# Patient Record
Sex: Female | Born: 1945 | Race: White | Hispanic: No | Smoking: Never smoker
Health system: Southern US, Community
[De-identification: ages and names within clinical notes are randomized; demographics above are authoritative.]

## PROBLEM LIST (undated history)

## (undated) DIAGNOSIS — A159 Respiratory tuberculosis unspecified: Secondary | ICD-10-CM

## (undated) DIAGNOSIS — R6889 Other general symptoms and signs: Secondary | ICD-10-CM

## (undated) DIAGNOSIS — M1990 Unspecified osteoarthritis, unspecified site: Secondary | ICD-10-CM

## (undated) DIAGNOSIS — H269 Unspecified cataract: Secondary | ICD-10-CM

## (undated) DIAGNOSIS — F419 Anxiety disorder, unspecified: Secondary | ICD-10-CM

## (undated) DIAGNOSIS — E785 Hyperlipidemia, unspecified: Secondary | ICD-10-CM

## (undated) DIAGNOSIS — K5792 Diverticulitis of intestine, part unspecified, without perforation or abscess without bleeding: Secondary | ICD-10-CM

## (undated) DIAGNOSIS — I10 Essential (primary) hypertension: Secondary | ICD-10-CM

## (undated) HISTORY — DX: Unspecified cataract: H26.9

## (undated) HISTORY — PX: OTHER SURGICAL HISTORY: SHX169

## (undated) HISTORY — PX: NEPHRECTOMY: SHX65

## (undated) HISTORY — PX: ABDOMINAL HYSTERECTOMY: SHX81

## (undated) HISTORY — DX: Diverticulitis of intestine, part unspecified, without perforation or abscess without bleeding: K57.92

## (undated) HISTORY — DX: Anxiety disorder, unspecified: F41.9

## (undated) HISTORY — DX: Respiratory tuberculosis unspecified: A15.9

## (undated) HISTORY — DX: Essential (primary) hypertension: I10

## (undated) HISTORY — PX: BREAST BIOPSY: SHX20

## (undated) HISTORY — DX: Unspecified osteoarthritis, unspecified site: M19.90

## (undated) HISTORY — DX: Other general symptoms and signs: R68.89

## (undated) HISTORY — DX: Hyperlipidemia, unspecified: E78.5

---

## 1991-11-06 HISTORY — PX: NEPHRECTOMY: SHX65

## 1995-11-06 HISTORY — PX: ABDOMINAL HYSTERECTOMY: SHX81

## 1998-03-17 ENCOUNTER — Ambulatory Visit (HOSPITAL_COMMUNITY): Admission: RE | Admit: 1998-03-17 | Discharge: 1998-03-17 | Payer: Self-pay | Admitting: Obstetrics and Gynecology

## 1999-02-16 ENCOUNTER — Other Ambulatory Visit: Admission: RE | Admit: 1999-02-16 | Discharge: 1999-02-16 | Payer: Self-pay | Admitting: Obstetrics and Gynecology

## 1999-03-16 ENCOUNTER — Ambulatory Visit (HOSPITAL_COMMUNITY): Admission: RE | Admit: 1999-03-16 | Discharge: 1999-03-16 | Payer: Self-pay | Admitting: Obstetrics and Gynecology

## 1999-03-16 ENCOUNTER — Encounter: Payer: Self-pay | Admitting: Obstetrics and Gynecology

## 1999-03-23 ENCOUNTER — Encounter: Payer: Self-pay | Admitting: Obstetrics and Gynecology

## 1999-03-23 ENCOUNTER — Ambulatory Visit (HOSPITAL_COMMUNITY): Admission: RE | Admit: 1999-03-23 | Discharge: 1999-03-23 | Payer: Self-pay | Admitting: Obstetrics and Gynecology

## 1999-09-21 ENCOUNTER — Encounter: Payer: Self-pay | Admitting: Obstetrics and Gynecology

## 1999-09-21 ENCOUNTER — Ambulatory Visit (HOSPITAL_COMMUNITY): Admission: RE | Admit: 1999-09-21 | Discharge: 1999-09-21 | Payer: Self-pay | Admitting: Obstetrics and Gynecology

## 2000-03-28 ENCOUNTER — Encounter: Payer: Self-pay | Admitting: Obstetrics and Gynecology

## 2000-03-28 ENCOUNTER — Encounter: Admission: RE | Admit: 2000-03-28 | Discharge: 2000-03-28 | Payer: Self-pay | Admitting: Obstetrics and Gynecology

## 2001-03-27 ENCOUNTER — Encounter: Admission: RE | Admit: 2001-03-27 | Discharge: 2001-03-27 | Payer: Self-pay | Admitting: Obstetrics and Gynecology

## 2001-03-27 ENCOUNTER — Encounter: Payer: Self-pay | Admitting: Obstetrics and Gynecology

## 2001-03-27 ENCOUNTER — Ambulatory Visit (HOSPITAL_COMMUNITY): Admission: RE | Admit: 2001-03-27 | Discharge: 2001-03-27 | Payer: Self-pay | Admitting: Obstetrics and Gynecology

## 2002-04-23 ENCOUNTER — Ambulatory Visit (HOSPITAL_COMMUNITY): Admission: RE | Admit: 2002-04-23 | Discharge: 2002-04-23 | Payer: Self-pay | Admitting: Internal Medicine

## 2002-04-23 ENCOUNTER — Encounter: Payer: Self-pay | Admitting: Obstetrics and Gynecology

## 2003-04-26 ENCOUNTER — Encounter: Admission: RE | Admit: 2003-04-26 | Discharge: 2003-04-26 | Payer: Self-pay | Admitting: Family Medicine

## 2004-05-25 ENCOUNTER — Ambulatory Visit (HOSPITAL_COMMUNITY): Admission: RE | Admit: 2004-05-25 | Discharge: 2004-05-25 | Payer: Self-pay | Admitting: Family Medicine

## 2005-03-19 ENCOUNTER — Ambulatory Visit: Payer: Self-pay | Admitting: Family Medicine

## 2005-03-20 ENCOUNTER — Ambulatory Visit: Payer: Self-pay | Admitting: Family Medicine

## 2005-03-20 ENCOUNTER — Other Ambulatory Visit: Admission: RE | Admit: 2005-03-20 | Discharge: 2005-03-20 | Payer: Self-pay | Admitting: Family Medicine

## 2006-07-22 ENCOUNTER — Ambulatory Visit: Payer: Self-pay | Admitting: Family Medicine

## 2007-07-15 ENCOUNTER — Ambulatory Visit (HOSPITAL_COMMUNITY): Admission: RE | Admit: 2007-07-15 | Discharge: 2007-07-15 | Payer: Self-pay | Admitting: Internal Medicine

## 2007-11-06 HISTORY — PX: OTHER SURGICAL HISTORY: SHX169

## 2008-08-22 ENCOUNTER — Emergency Department (HOSPITAL_COMMUNITY): Admission: EM | Admit: 2008-08-22 | Discharge: 2008-08-23 | Payer: Self-pay | Admitting: Emergency Medicine

## 2008-08-23 ENCOUNTER — Telehealth (INDEPENDENT_AMBULATORY_CARE_PROVIDER_SITE_OTHER): Payer: Self-pay | Admitting: *Deleted

## 2010-06-21 ENCOUNTER — Encounter: Admission: RE | Admit: 2010-06-21 | Discharge: 2010-06-21 | Payer: Self-pay | Admitting: Family Medicine

## 2010-06-29 ENCOUNTER — Encounter: Payer: Self-pay | Admitting: Family Medicine

## 2010-12-07 NOTE — Letter (Signed)
Summary: [facility]   Imported By: Lanelle Bal 07/18/2010 09:58:29  _____________________________________________________________________  External Attachment:    Type:   Image     Comment:   External Document

## 2011-02-27 ENCOUNTER — Ambulatory Visit (AMBULATORY_SURGERY_CENTER): Payer: Federal, State, Local not specified - PPO | Admitting: *Deleted

## 2011-02-27 ENCOUNTER — Encounter: Payer: Self-pay | Admitting: Gastroenterology

## 2011-02-27 VITALS — Ht 62.0 in | Wt 166.1 lb

## 2011-02-27 DIAGNOSIS — Z1211 Encounter for screening for malignant neoplasm of colon: Secondary | ICD-10-CM

## 2011-02-27 MED ORDER — PEG-KCL-NACL-NASULF-NA ASC-C 100 G PO SOLR: ORAL | Status: DC

## 2011-03-09 ENCOUNTER — Encounter: Payer: Self-pay | Admitting: Gastroenterology

## 2011-03-12 ENCOUNTER — Ambulatory Visit (AMBULATORY_SURGERY_CENTER): Payer: Federal, State, Local not specified - PPO | Admitting: Gastroenterology

## 2011-03-12 ENCOUNTER — Encounter: Payer: Self-pay | Admitting: Gastroenterology

## 2011-03-12 DIAGNOSIS — K5730 Diverticulosis of large intestine without perforation or abscess without bleeding: Secondary | ICD-10-CM

## 2011-03-12 DIAGNOSIS — K5792 Diverticulitis of intestine, part unspecified, without perforation or abscess without bleeding: Secondary | ICD-10-CM

## 2011-03-12 DIAGNOSIS — K5732 Diverticulitis of large intestine without perforation or abscess without bleeding: Secondary | ICD-10-CM

## 2011-03-12 DIAGNOSIS — Z1211 Encounter for screening for malignant neoplasm of colon: Secondary | ICD-10-CM

## 2011-03-12 MED ORDER — SODIUM CHLORIDE 0.9 % IV SOLN: 500.0000 mL | INTRAVENOUS | Status: DC

## 2011-03-12 MED ORDER — CIPROFLOXACIN HCL 500 MG PO TABS: 500.0000 mg | ORAL_TABLET | Freq: Two times a day (BID) | ORAL | Status: AC

## 2011-03-12 NOTE — Patient Instructions (Addendum)
Please pick up Cipro at [facility] and take 1 tablet twice daily for 10 days. Please review discharge instructions Please start Metamucil or Benefiber- buy over the counter and follow instructions

## 2011-03-13 ENCOUNTER — Telehealth: Payer: Self-pay

## 2011-03-13 NOTE — Telephone Encounter (Signed)

## 2011-03-16 ENCOUNTER — Encounter: Payer: Self-pay | Admitting: Gastroenterology

## 2011-08-07 LAB — POCT I-STAT, CHEM 8
BUN: 18
Chloride: 102
Creatinine, Ser: 1
Potassium: 3.8
TCO2: 31

## 2012-03-12 ENCOUNTER — Other Ambulatory Visit: Payer: Self-pay | Admitting: Family Medicine

## 2012-03-26 ENCOUNTER — Encounter: Payer: Self-pay | Admitting: Family Medicine

## 2012-03-26 ENCOUNTER — Ambulatory Visit (INDEPENDENT_AMBULATORY_CARE_PROVIDER_SITE_OTHER): Payer: Federal, State, Local not specified - PPO | Admitting: Family Medicine

## 2012-03-26 VITALS — BP 148/76 | HR 77 | Temp 98.3°F | Resp 12 | Ht 61.5 in | Wt 163.8 lb

## 2012-03-26 DIAGNOSIS — E669 Obesity, unspecified: Secondary | ICD-10-CM

## 2012-03-26 DIAGNOSIS — I10 Essential (primary) hypertension: Secondary | ICD-10-CM

## 2012-03-26 DIAGNOSIS — R0789 Other chest pain: Secondary | ICD-10-CM

## 2012-03-26 DIAGNOSIS — E7800 Pure hypercholesterolemia, unspecified: Secondary | ICD-10-CM

## 2012-03-26 LAB — LIPID PANEL
Cholesterol: 262 mg/dL — ABNORMAL HIGH (ref 0–200)
HDL: 62 mg/dL (ref >39)
LDL Cholesterol: 180 mg/dL — ABNORMAL HIGH (ref 0–99)
Total CHOL/HDL Ratio: 4.2 Ratio
Triglycerides: 102 mg/dL (ref <150)
VLDL: 20 mg/dL (ref 0–40)

## 2012-03-26 LAB — BASIC METABOLIC PANEL
CO2: 26 mEq/L (ref 19–32)
Calcium: 9.8 mg/dL (ref 8.4–10.5)
Creat: 0.74 mg/dL (ref 0.50–1.10)
Sodium: 140 mEq/L (ref 135–145)

## 2012-03-26 MED ORDER — BENAZEPRIL-HYDROCHLOROTHIAZIDE 20-12.5 MG PO TABS: 1.0000 | ORAL_TABLET | Freq: Every day | ORAL | Status: DC

## 2012-03-26 NOTE — Patient Instructions (Signed)
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
## 2012-03-27 LAB — VITAMIN D 25 HYDROXY (VIT D DEFICIENCY, FRACTURES): Vit D, 25-Hydroxy: 59 ng/mL (ref 30–89)

## 2012-04-01 MED ORDER — PRAVASTATIN SODIUM 40 MG PO TABS: 40.0000 mg | ORAL_TABLET | Freq: Every day | ORAL | Status: DC

## 2012-04-01 NOTE — Progress Notes (Signed)
Addended by: Dow Adolph B on: 04/01/2012 05:39 PM   Modules accepted: Orders

## 2012-04-01 NOTE — Progress Notes (Signed)
Quick Note:  Please call pt and advise that the following labs are abnormal...  Lipids are very high (Total cholesterol and LDL-"bad" cholesterol). I have e-prescribed Pravastatin 40 mg 1 tablet every evening after last meal. Med is at her pharmacy for pick-up.  Return in 8-12 weeks to have lipids rechecked.   Copy to pt. ______

## 2012-04-03 ENCOUNTER — Encounter: Payer: Self-pay | Admitting: Family Medicine

## 2012-04-03 NOTE — Progress Notes (Signed)
LMOM to call back (on Cell and H phones)

## 2012-04-04 ENCOUNTER — Other Ambulatory Visit: Payer: Self-pay | Admitting: Family Medicine

## 2012-10-14 ENCOUNTER — Other Ambulatory Visit: Payer: Self-pay | Admitting: Physician Assistant

## 2012-10-14 NOTE — Telephone Encounter (Signed)
Needs OV.  

## 2012-12-13 ENCOUNTER — Other Ambulatory Visit: Payer: Self-pay | Admitting: Physician Assistant

## 2013-03-04 ENCOUNTER — Encounter: Payer: Self-pay | Admitting: Family Medicine

## 2013-03-04 ENCOUNTER — Ambulatory Visit (INDEPENDENT_AMBULATORY_CARE_PROVIDER_SITE_OTHER): Payer: Federal, State, Local not specified - PPO | Admitting: Family Medicine

## 2013-03-04 VITALS — BP 159/72 | HR 67 | Temp 98.0°F | Resp 18 | Wt 160.0 lb

## 2013-03-04 DIAGNOSIS — R5381 Other malaise: Secondary | ICD-10-CM

## 2013-03-04 DIAGNOSIS — I10 Essential (primary) hypertension: Secondary | ICD-10-CM

## 2013-03-04 DIAGNOSIS — K5732 Diverticulitis of large intestine without perforation or abscess without bleeding: Secondary | ICD-10-CM | POA: Insufficient documentation

## 2013-03-04 DIAGNOSIS — R079 Chest pain, unspecified: Secondary | ICD-10-CM

## 2013-03-04 DIAGNOSIS — Z87828 Personal history of other (healed) physical injury and trauma: Secondary | ICD-10-CM | POA: Insufficient documentation

## 2013-03-04 DIAGNOSIS — J309 Allergic rhinitis, unspecified: Secondary | ICD-10-CM

## 2013-03-04 DIAGNOSIS — Z566 Other physical and mental strain related to work: Secondary | ICD-10-CM | POA: Insufficient documentation

## 2013-03-04 DIAGNOSIS — E7800 Pure hypercholesterolemia, unspecified: Secondary | ICD-10-CM

## 2013-03-04 LAB — CBC WITH DIFFERENTIAL/PLATELET
Eosinophils Relative: 2 % (ref 0–5)
Hemoglobin: 14.2 g/dL (ref 12.0–15.0)
Lymphocytes Relative: 23 % (ref 12–46)
Lymphs Abs: 1.3 10*3/uL (ref 0.7–4.0)
MCV: 91.6 fL (ref 78.0–100.0)
Monocytes Relative: 7 % (ref 3–12)
Platelets: 379 10*3/uL (ref 150–400)
RBC: 4.52 MIL/uL (ref 3.87–5.11)
WBC: 5.8 10*3/uL (ref 4.0–10.5)

## 2013-03-04 LAB — COMPREHENSIVE METABOLIC PANEL
ALT: 20 U/L (ref 0–35)
AST: 20 U/L (ref 0–37)
Albumin: 4.5 g/dL (ref 3.5–5.2)
CO2: 30 mEq/L (ref 19–32)
Calcium: 9.8 mg/dL (ref 8.4–10.5)
Chloride: 100 mEq/L (ref 96–112)
Creat: 0.75 mg/dL (ref 0.50–1.10)
Potassium: 4.3 mEq/L (ref 3.5–5.3)
Total Protein: 7.1 g/dL (ref 6.0–8.3)

## 2013-03-04 LAB — LIPID PANEL
Cholesterol: 262 mg/dL — ABNORMAL HIGH (ref 0–200)
LDL Cholesterol: 178 mg/dL — ABNORMAL HIGH (ref 0–99)
Triglycerides: 133 mg/dL (ref <150)

## 2013-03-04 MED ORDER — BENAZEPRIL-HYDROCHLOROTHIAZIDE 20-12.5 MG PO TABS: ORAL_TABLET | ORAL | Status: DC

## 2013-03-04 NOTE — Patient Instructions (Signed)
Blood pressure medication has been increased to twice a day. For your allergies, it would be good to get an over-the-counter medication (Generic Zyrtec which is Cetririzine or Allegra which is Fexofenadine) and take it every night. Also, an air purifier in your bedroom or elsewhere in the home helps reduce allergy symptoms.  I am referring you to a Cardiologist for more thorough evaluation.
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
## 2013-03-05 ENCOUNTER — Encounter: Payer: Self-pay | Admitting: Family Medicine

## 2013-03-05 ENCOUNTER — Other Ambulatory Visit: Payer: Self-pay | Admitting: Family Medicine

## 2013-03-05 MED ORDER — PRAVASTATIN SODIUM 40 MG PO TABS: ORAL_TABLET | ORAL | Status: DC

## 2013-03-11 ENCOUNTER — Telehealth: Payer: Self-pay

## 2013-03-11 DIAGNOSIS — E7800 Pure hypercholesterolemia, unspecified: Secondary | ICD-10-CM

## 2013-03-11 NOTE — Telephone Encounter (Signed)
Acknowledged pt refusal to take statin. This may have been discussed in the past and pt may have concern about side effects. I will remove the medication from medication list and make a note of the fact that pt refuses to take statin.

## 2013-03-11 NOTE — Telephone Encounter (Signed)
Message copied by Fidel Levy on Wed Mar 11, 2013 11:13 AM ------      Message from: Horice Carrero, RENEE R      Created: Wed Mar 11, 2013 11:11 AM      Regarding: FW: labs results                   ----- Message -----         From: Maurice March, MD         Sent: 03/10/2013   6:35 PM           To: [facility] Clinical Message Pool      Subject: labs results                                             I see where pt has not read her MyChart message. Please call her with her results and let her know that I prescribed a medication for her to take to lower her lipid values. ------

## 2013-03-11 NOTE — Telephone Encounter (Signed)
Called patient.  She will not take a Cholesterol medication.  She needed to go to work, I advised her I would like Dr. Audria Nine know.  Patient then hung up the phone.

## 2013-03-12 ENCOUNTER — Other Ambulatory Visit: Payer: Self-pay | Admitting: Physician Assistant

## 2013-03-12 ENCOUNTER — Institutional Professional Consult (permissible substitution): Payer: Federal, State, Local not specified - PPO | Admitting: Cardiovascular Disease

## 2013-03-13 NOTE — Telephone Encounter (Signed)
Pt should only be taking Benazepril-HCTZ 20-12.5 mg; this was refilled at last visit.

## 2013-03-13 NOTE — Telephone Encounter (Signed)
Dr Audria Nine, I wanted to check with you to make sure you want pt on both the Lotensin and Lisinopril-HCTZ. I see that you increased her Lotensin to BID at last OV, but couldn't tell about the lisinopril. Do you want to RF?

## 2013-03-26 ENCOUNTER — Ambulatory Visit (INDEPENDENT_AMBULATORY_CARE_PROVIDER_SITE_OTHER): Payer: Federal, State, Local not specified - PPO | Admitting: Family Medicine

## 2013-03-26 VITALS — BP 164/80 | HR 92 | Temp 98.6°F | Resp 18 | Ht 61.5 in | Wt 163.0 lb

## 2013-03-26 DIAGNOSIS — J309 Allergic rhinitis, unspecified: Secondary | ICD-10-CM

## 2013-03-26 DIAGNOSIS — R059 Cough, unspecified: Secondary | ICD-10-CM

## 2013-03-26 DIAGNOSIS — R05 Cough: Secondary | ICD-10-CM

## 2013-03-26 DIAGNOSIS — J019 Acute sinusitis, unspecified: Secondary | ICD-10-CM

## 2013-03-26 MED ORDER — FLUTICASONE PROPIONATE 50 MCG/ACT NA SUSP: 2.0000 | Freq: Every day | NASAL | Status: DC

## 2013-03-26 MED ORDER — BENZONATATE 100 MG PO CAPS: 200.0000 mg | ORAL_CAPSULE | Freq: Two times a day (BID) | ORAL | Status: DC | PRN

## 2013-03-26 MED ORDER — AMOXICILLIN-POT CLAVULANATE 875-125 MG PO TABS: 1.0000 | ORAL_TABLET | Freq: Two times a day (BID) | ORAL | Status: DC

## 2013-03-26 NOTE — Patient Instructions (Signed)

## 2013-03-26 NOTE — Progress Notes (Signed)
[facility]:  Office Visit  Chief Complaint:  Chief Complaint  Patient presents with  . Fever    yesterday  . Cough    started Mucinex and antihistamine  . Sinusitis    teeth pain, ST- began Monday    HPI: Lindsey Shepard is a 67 y.o. female who complains of here for worsening sinus congestion and facial pain, woke up Monday with severe sore throat, took Tylenol. PND cough, has issues with sinus in last 4 weeks ago and everything is clear. She has a lot of pressure in her face. Took zyrtec, took mucinex without relief. She also took promethazine cough syrup without relief. Has a history of TB. Feels like it is going down her chest but face pain is an issue. + sick sick contacts. Nonsmoker. + subjective fever. No CP/SOB/wheezes  Past Medical History  Diagnosis Date  . Hypertension   . Anxiety   . Arthritis   . Hyperlipidemia   . Tuberculosis     as child  . Diverticulitis   . Other abnormal clinical finding     mirco preforation with diverticulitis   Past Surgical History  Procedure Laterality Date  . Nephrectomy      right  . Abdominal hysterectomy    . Fracture left arm      with hardware  . Left arm surgery      reflex sympathetic dystrophy- 9 surgeries   History   Social History  . Marital Status: Married    Spouse Name: N/A    Number of Children: N/A  . Years of Education: N/A   Social History Main Topics  . Smoking status: Never Smoker   . Smokeless tobacco: None  . Alcohol Use: 2.4 oz/week    4 Glasses of wine per week     Comment: 1 glass/day  . Drug Use: No  . Sexually Active: None   Other Topics Concern  . None   Social History Narrative  . None   Family History  Problem Relation Age of Onset  . Heart disease Mother   . Diabetes Father   . Pancreatic cancer Father   . Heart disease Brother    Allergies  Allergen Reactions  . Motrin (Ibuprofen) Swelling  . Percocet (Oxycodone-Acetaminophen) Nausea And Vomiting  .  Vicodin (Hydrocodone-Acetaminophen) Nausea Only   Prior to Admission medications   Medication Sig Start Date End Date Taking? Authorizing Provider  Calcium Carb-Cholecalciferol (CALCIUM 1000 + D PO) Take by mouth. Take 1 tab in am and 2 tabs in pm    Yes Historical Provider, MD  glucosamine-chondroitin 500-400 MG tablet Take 1 tablet by mouth daily.   Yes Historical Provider, MD  Multiple Vitamins-Minerals (MULTIVITAMIN WITH MINERALS) tablet Take 1 tablet by mouth daily.     Yes Historical Provider, MD  Omega-3 Fatty Acids (FISH OIL) 1000 MG CAPS Take by mouth 2 (two) times daily.     Yes Historical Provider, MD  Probiotic Product (PROBIOTIC PO) Take by mouth 2 (two) times daily.     Yes Historical Provider, MD  benazepril-hydrochlorthiazide (LOTENSIN HCT) 20-12.5 MG per tablet Take 1 tablet twice a day to lower blood pressure. 03/04/13   Maurice March, MD     ROS: The patient denies  chills, night sweats, unintentional weight loss, chest pain, palpitations, wheezing, dyspnea on exertion, nausea, vomiting, abdominal pain, dysuria, hematuria, melena, numbness, weakness, or tingling.   All other systems have been reviewed and were otherwise negative with the  exception of those mentioned in the HPI and as above.    PHYSICAL EXAM: Filed Vitals:   03/26/13 0839  BP: 164/80  Pulse: 92  Temp: 98.6 F (37 C)  Resp: 18   Filed Vitals:   03/26/13 0839  Height: 5' 1.5" (1.562 m)  Weight: 163 lb (73.936 kg)   Body mass index is 30.3 kg/(m^2).  General: Alert, no acute distress HEENT:  Normocephalic, atraumatic, oropharynx patent. TM nl. + sinus tenderness. No exudates. Boggy erythematous nares.  Cardiovascular:  Regular rate and rhythm, no rubs murmurs or gallops.  No Carotid bruits, radial pulse intact. No pedal edema.  Respiratory: Clear to auscultation bilaterally.  No wheezes, rales, or rhonchi.  No cyanosis, no use of accessory musculature GI: No organomegaly, abdomen is soft and  non-tender, positive bowel sounds.  No masses. Skin: No rashes. Neurologic: Facial musculature symmetric. Psychiatric: Patient is appropriate throughout our interaction. Lymphatic: No cervical lymphadenopathy Musculoskeletal: Gait intact.   LABS: Results for orders placed in visit on 03/04/13  COMPREHENSIVE METABOLIC PANEL      Result Value Range   Sodium 142  135 - 145 mEq/L   Potassium 4.3  3.5 - 5.3 mEq/L   Chloride 100  96 - 112 mEq/L   CO2 30  19 - 32 mEq/L   Glucose, Bld 99  70 - 99 mg/dL   BUN 11  6 - 23 mg/dL   Creat 4.33  2.95 - 1.88 mg/dL   Total Bilirubin 0.6  0.3 - 1.2 mg/dL   Alkaline Phosphatase 76  39 - 117 U/L   AST 20  0 - 37 U/L   ALT 20  0 - 35 U/L   Total Protein 7.1  6.0 - 8.3 g/dL   Albumin 4.5  3.5 - 5.2 g/dL   Calcium 9.8  8.4 - 41.6 mg/dL  LIPID PANEL      Result Value Range   Cholesterol 262 (*) 0 - 200 mg/dL   Triglycerides 606  <301 mg/dL   HDL 57  >60 mg/dL   Total CHOL/HDL Ratio 4.6     VLDL 27  0 - 40 mg/dL   LDL Cholesterol 109 (*) 0 - 99 mg/dL  TSH      Result Value Range   TSH 2.261  0.350 - 4.500 uIU/mL  T3, FREE      Result Value Range   T3, Free 3.1  2.3 - 4.2 pg/mL  CBC WITH DIFFERENTIAL      Result Value Range   WBC 5.8  4.0 - 10.5 K/uL   RBC 4.52  3.87 - 5.11 MIL/uL   Hemoglobin 14.2  12.0 - 15.0 g/dL   HCT 32.3  55.7 - 32.2 %   MCV 91.6  78.0 - 100.0 fL   MCH 31.4  26.0 - 34.0 pg   MCHC 34.3  30.0 - 36.0 g/dL   RDW 02.5  42.7 - 06.2 %   Platelets 379  150 - 400 K/uL   Neutrophils Relative % 68  43 - 77 %   Neutro Abs 3.9  1.7 - 7.7 K/uL   Lymphocytes Relative 23  12 - 46 %   Lymphs Abs 1.3  0.7 - 4.0 K/uL   Monocytes Relative 7  3 - 12 %   Monocytes Absolute 0.4  0.1 - 1.0 K/uL   Eosinophils Relative 2  0 - 5 %   Eosinophils Absolute 0.1  0.0 - 0.7 K/uL   Basophils Relative 0  0 -  1 %   Basophils Absolute 0.0  0.0 - 0.1 K/uL   Smear Review Criteria for review not met       EKG/XRAY:   Primary read interpreted  by Dr. Conley Rolls at [facility].   ASSESSMENT/PLAN: Encounter Diagnoses  Name Primary?  . Acute sinusitis Yes  . Cough   . Allergic rhinitis    Advise to take HTN meds, has not taken it today Rx Augmentin Rx Flonase Rx Tessalon Perles Gross sideeffects, risk and benefits, and alternatives of medications d/w patient. Patient is aware that all medications have potential sideeffects and we are unable to predict every sideeffect or drug-drug interaction that may occur. F/u prn     Carneshia Raker PHUONG, DO 03/26/2013 9:12 AM

## 2013-03-29 ENCOUNTER — Encounter: Payer: Self-pay | Admitting: Family Medicine

## 2013-04-06 ENCOUNTER — Encounter: Payer: Self-pay | Admitting: Cardiovascular Disease

## 2013-04-06 ENCOUNTER — Ambulatory Visit (INDEPENDENT_AMBULATORY_CARE_PROVIDER_SITE_OTHER): Payer: Federal, State, Local not specified - PPO | Admitting: Cardiovascular Disease

## 2013-04-06 VITALS — Ht 62.0 in | Wt 162.0 lb

## 2013-04-06 DIAGNOSIS — R079 Chest pain, unspecified: Secondary | ICD-10-CM

## 2013-04-06 DIAGNOSIS — E7800 Pure hypercholesterolemia, unspecified: Secondary | ICD-10-CM

## 2013-04-06 DIAGNOSIS — I10 Essential (primary) hypertension: Secondary | ICD-10-CM

## 2013-04-06 MED ORDER — MULTI-VITAMIN/MINERALS PO TABS: 3.0000 | ORAL_TABLET | Freq: Every day | ORAL | Status: DC

## 2013-04-06 MED ORDER — BENAZEPRIL-HYDROCHLOROTHIAZIDE 20-12.5 MG PO TABS: ORAL_TABLET | ORAL | Status: DC

## 2013-04-06 MED ORDER — FISH OIL 1000 MG PO CAPS: ORAL_CAPSULE | ORAL | Status: AC
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
## 2013-04-06 NOTE — Patient Instructions (Signed)
Your physician recommends that you schedule a follow-up appointment in: AS NEEDED  Your physician recommends that you continue on your current medications as directed. Please refer to the Current Medication list given to you today.  

## 2013-04-06 NOTE — Assessment & Plan Note (Signed)
F/U with primary May not be ideally controlled No LVH on ECG
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
## 2013-04-06 NOTE — Assessment & Plan Note (Signed)
Discussed diet and lifestyle issues Since her 10 year risk of CAD is likely under 7.5% statin Rx not critical for her primary prevention Discussed calcium score with patient.

## 2013-06-10 ENCOUNTER — Other Ambulatory Visit: Payer: Self-pay

## 2013-09-10 ENCOUNTER — Other Ambulatory Visit: Payer: Self-pay

## 2013-12-17 ENCOUNTER — Other Ambulatory Visit: Payer: Self-pay | Admitting: Family Medicine

## 2014-03-15 ENCOUNTER — Encounter: Payer: Self-pay | Admitting: Family Medicine

## 2014-03-15 ENCOUNTER — Ambulatory Visit (INDEPENDENT_AMBULATORY_CARE_PROVIDER_SITE_OTHER): Payer: Federal, State, Local not specified - PPO | Admitting: Family Medicine

## 2014-03-15 VITALS — BP 132/80 | HR 67 | Temp 98.2°F | Resp 16 | Ht 61.5 in | Wt 162.6 lb

## 2014-03-15 DIAGNOSIS — R5381 Other malaise: Secondary | ICD-10-CM

## 2014-03-15 DIAGNOSIS — R5383 Other fatigue: Secondary | ICD-10-CM

## 2014-03-15 DIAGNOSIS — M7500 Adhesive capsulitis of unspecified shoulder: Secondary | ICD-10-CM

## 2014-03-15 DIAGNOSIS — I10 Essential (primary) hypertension: Secondary | ICD-10-CM

## 2014-03-15 DIAGNOSIS — R7309 Other abnormal glucose: Secondary | ICD-10-CM

## 2014-03-15 DIAGNOSIS — J309 Allergic rhinitis, unspecified: Secondary | ICD-10-CM

## 2014-03-15 LAB — TSH: TSH: 1.666 u[IU]/mL (ref 0.350–4.500)

## 2014-03-15 LAB — CBC WITH DIFFERENTIAL/PLATELET
BASOS ABS: 0 10*3/uL (ref 0.0–0.1)
Basophils Relative: 0 % (ref 0–1)
EOS PCT: 3 % (ref 0–5)
Eosinophils Absolute: 0.2 10*3/uL (ref 0.0–0.7)
HEMATOCRIT: 37.3 % (ref 36.0–46.0)
HEMOGLOBIN: 12.7 g/dL (ref 12.0–15.0)
LYMPHS ABS: 1.4 10*3/uL (ref 0.7–4.0)
LYMPHS PCT: 23 % (ref 12–46)
MCH: 30.5 pg (ref 26.0–34.0)
MCHC: 34 g/dL (ref 30.0–36.0)
MCV: 89.7 fL (ref 78.0–100.0)
MONO ABS: 0.5 10*3/uL (ref 0.1–1.0)
MONOS PCT: 9 % (ref 3–12)
NEUTROS ABS: 3.9 10*3/uL (ref 1.7–7.7)
Neutrophils Relative %: 65 % (ref 43–77)
Platelets: 364 10*3/uL (ref 150–400)
RBC: 4.16 MIL/uL (ref 3.87–5.11)
RDW: 14 % (ref 11.5–15.5)
WBC: 6 10*3/uL (ref 4.0–10.5)

## 2014-03-15 LAB — COMPREHENSIVE METABOLIC PANEL
ALBUMIN: 4.3 g/dL (ref 3.5–5.2)
ALK PHOS: 77 U/L (ref 39–117)
ALT: 19 U/L (ref 0–35)
AST: 17 U/L (ref 0–37)
BUN: 18 mg/dL (ref 6–23)
CALCIUM: 9 mg/dL (ref 8.4–10.5)
CHLORIDE: 104 meq/L (ref 96–112)
CO2: 28 meq/L (ref 19–32)
Creat: 0.76 mg/dL (ref 0.50–1.10)
GLUCOSE: 113 mg/dL — AB (ref 70–99)
POTASSIUM: 4.3 meq/L (ref 3.5–5.3)
SODIUM: 141 meq/L (ref 135–145)
TOTAL PROTEIN: 6.5 g/dL (ref 6.0–8.3)
Total Bilirubin: 0.5 mg/dL (ref 0.2–1.2)

## 2014-03-15 MED ORDER — FLUTICASONE PROPIONATE 50 MCG/ACT NA SUSP: 2.0000 | Freq: Every day | NASAL | Status: DC

## 2014-03-15 MED ORDER — BENAZEPRIL-HYDROCHLOROTHIAZIDE 20-12.5 MG PO TABS: 1.0000 | ORAL_TABLET | Freq: Every day | ORAL | Status: DC

## 2014-03-15 NOTE — Patient Instructions (Signed)
Take your blood pressure medication once a day- please keep an eye on your blood pressure and let me know if once a day is not enough.    Perhaps try some melatonin when you get home to help yourself get to sleep more easily.  This may help you get more total hours of sleep  Let me know if you would like to pursue some formal PT for your shoulder.  I do think PT will help you to improve and maintain your range of motion.  It is ok to take tylenol before bed if needed for your shoulder.  Remember not to take more than recommended on the bottle, and to avoid excessive alcohol use when you are on tylenol.    I will be in touch with your labs asap
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
## 2014-03-16 NOTE — Addendum Note (Signed)
Addended by: Lamar Blinks C on: 03/16/2014 01:54 PM   Modules accepted: Orders

## 2014-04-02 ENCOUNTER — Ambulatory Visit (INDEPENDENT_AMBULATORY_CARE_PROVIDER_SITE_OTHER): Payer: Federal, State, Local not specified - PPO | Admitting: Family Medicine

## 2014-04-02 VITALS — BP 158/84 | HR 70 | Temp 97.9°F | Resp 16 | Ht 61.5 in | Wt 162.0 lb

## 2014-04-02 DIAGNOSIS — W57XXXA Bitten or stung by nonvenomous insect and other nonvenomous arthropods, initial encounter: Secondary | ICD-10-CM

## 2014-04-02 DIAGNOSIS — T148 Other injury of unspecified body region: Secondary | ICD-10-CM

## 2014-04-02 DIAGNOSIS — R21 Rash and other nonspecific skin eruption: Secondary | ICD-10-CM

## 2014-04-02 MED ORDER — DOXYCYCLINE HYCLATE 100 MG PO TABS: 100.0000 mg | ORAL_TABLET | Freq: Two times a day (BID) | ORAL | Status: DC
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
## 2014-04-02 NOTE — Patient Instructions (Signed)
Tick Bite Information Ticks are insects that attach themselves to the skin and draw blood for food. There are various types of ticks. Common types include wood ticks and deer ticks. Most ticks live in shrubs and grassy areas. Ticks can climb onto your body when you make contact with leaves or grass where the tick is waiting. The most common places on the body for ticks to attach themselves are the scalp, neck, armpits, waist, and groin. Most tick bites are harmless, but sometimes ticks carry germs that cause diseases. These germs can be spread to a person during the tick's feeding process. The chance of a disease spreading through a tick bite depends on:   The type of tick.  Time of year.   How long the tick is attached.   Geographic location.  HOW CAN YOU PREVENT TICK BITES? Take these steps to help prevent tick bites when you are outdoors:  Wear protective clothing. Long sleeves and long pants are best.   Wear white clothes so you can see ticks more easily.  Tuck your pant legs into your socks.   If walking on a trail, stay in the middle of the trail to avoid brushing against bushes.  Avoid walking through areas with long grass.  Put insect repellent on all exposed skin and along boot tops, pant legs, and sleeve cuffs.   Check clothing, hair, and skin repeatedly and before going inside.   Brush off any ticks that are not attached.  Take a shower or bath as soon as possible after being outdoors.  WHAT IS THE PROPER WAY TO REMOVE A TICK? Ticks should be removed as soon as possible to help prevent diseases caused by tick bites. 1. If latex gloves are available, put them on before trying to remove a tick.  2. Using fine-point tweezers, grasp the tick as close to the skin as possible. You may also use curved forceps or a tick removal tool. Grasp the tick as close to its head as possible. Avoid grasping the tick on its body. 3. Pull gently with steady upward pressure until  the tick lets go. Do not twist the tick or jerk it suddenly. This may break off the tick's head or mouth parts. 4. Do not squeeze or crush the tick's body. This could force disease-carrying fluids from the tick into your body.  5. After the tick is removed, wash the bite area and your hands with soap and water or other disinfectant such as alcohol. 6. Apply a small amount of antiseptic cream or ointment to the bite site.  7. Wash and disinfect any instruments that were used.  Do not try to remove a tick by applying a hot match, petroleum jelly, or fingernail polish to the tick. These methods do not work and may increase the chances of disease being spread from the tick bite.  WHEN SHOULD YOU SEEK MEDICAL CARE? Contact your health care provider if you are unable to remove a tick from your skin or if a part of the tick breaks off and is stuck in the skin.  After a tick bite, you need to be aware of signs and symptoms that could be related to diseases spread by ticks. Contact your health care provider if you develop any of the following in the days or weeks after the tick bite:  Unexplained fever.  Rash. A circular rash that appears days or weeks after the tick bite may indicate the possibility of Lyme disease. The rash may resemble   a target with a bull's-eye and may occur at a different part of your body than the tick bite.  Redness and swelling in the area of the tick bite.   Tender, swollen lymph glands.   Diarrhea.   Weight loss.   Cough.   Fatigue.   Muscle, joint, or bone pain.   Abdominal pain.   Headache.   Lethargy or a change in your level of consciousness.  Difficulty walking or moving your legs.   Numbness in the legs.   Paralysis.  Shortness of breath.   Confusion.   Repeated vomiting.  Document Released: 10/19/2000 Document Revised: 08/12/2013 Document Reviewed: 04/01/2013 [employer] Patient Information 2014 [employer].  

## 2014-04-05 LAB — B. BURGDORFI ANTIBODIES: B burgdorferi Ab IgG+IgM: 0.06 {ISR}

## 2014-04-05 LAB — ROCKY MTN SPOTTED FVR AB, IGM-BLOOD: ROCKY MTN SPOTTED FEVER, IGM: 0.25 IV

## 2014-04-06 ENCOUNTER — Telehealth: Payer: Self-pay

## 2014-04-06 NOTE — Telephone Encounter (Signed)
Patient called concerning labs, reviewed labs with patient.  Patient states understanding.

## 2014-04-14 DIAGNOSIS — H2510 Age-related nuclear cataract, unspecified eye: Secondary | ICD-10-CM | POA: Insufficient documentation

## 2014-07-16 ENCOUNTER — Telehealth: Payer: Self-pay

## 2014-07-16 NOTE — Telephone Encounter (Signed)
Lm to advise pt to RTC- call to schedule an appt.
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
## 2015-03-14 ENCOUNTER — Encounter: Payer: Self-pay | Admitting: Family Medicine

## 2015-03-14 ENCOUNTER — Ambulatory Visit (INDEPENDENT_AMBULATORY_CARE_PROVIDER_SITE_OTHER): Payer: Federal, State, Local not specified - PPO | Admitting: Family Medicine

## 2015-03-14 VITALS — BP 140/80 | HR 70 | Temp 98.1°F | Resp 16 | Ht 62.0 in | Wt 160.0 lb

## 2015-03-14 DIAGNOSIS — I10 Essential (primary) hypertension: Secondary | ICD-10-CM | POA: Diagnosis not present

## 2015-03-14 DIAGNOSIS — M79676 Pain in unspecified toe(s): Secondary | ICD-10-CM | POA: Diagnosis not present

## 2015-03-14 DIAGNOSIS — R0982 Postnasal drip: Secondary | ICD-10-CM

## 2015-03-14 DIAGNOSIS — Z131 Encounter for screening for diabetes mellitus: Secondary | ICD-10-CM

## 2015-03-14 DIAGNOSIS — K589 Irritable bowel syndrome without diarrhea: Secondary | ICD-10-CM | POA: Diagnosis not present

## 2015-03-14 DIAGNOSIS — R05 Cough: Secondary | ICD-10-CM | POA: Diagnosis not present

## 2015-03-14 DIAGNOSIS — R059 Cough, unspecified: Secondary | ICD-10-CM

## 2015-03-14 DIAGNOSIS — R197 Diarrhea, unspecified: Secondary | ICD-10-CM

## 2015-03-14 DIAGNOSIS — Z13220 Encounter for screening for lipoid disorders: Secondary | ICD-10-CM | POA: Diagnosis not present

## 2015-03-14 DIAGNOSIS — Z23 Encounter for immunization: Secondary | ICD-10-CM | POA: Diagnosis not present

## 2015-03-14 LAB — COMPREHENSIVE METABOLIC PANEL
ALT: 20 U/L (ref 0–35)
AST: 19 U/L (ref 0–37)
Albumin: 4.4 g/dL (ref 3.5–5.2)
Alkaline Phosphatase: 68 U/L (ref 39–117)
BILIRUBIN TOTAL: 0.6 mg/dL (ref 0.2–1.2)
BUN: 19 mg/dL (ref 6–23)
CALCIUM: 9.3 mg/dL (ref 8.4–10.5)
CHLORIDE: 102 meq/L (ref 96–112)
CO2: 28 meq/L (ref 19–32)
Creat: 0.81 mg/dL (ref 0.50–1.10)
Glucose, Bld: 95 mg/dL (ref 70–99)
Potassium: 4.2 mEq/L (ref 3.5–5.3)
SODIUM: 142 meq/L (ref 135–145)
Total Protein: 6.7 g/dL (ref 6.0–8.3)

## 2015-03-14 LAB — LIPID PANEL
CHOLESTEROL: 261 mg/dL — AB (ref 0–200)
HDL: 63 mg/dL (ref >=46)
LDL Cholesterol: 184 mg/dL — ABNORMAL HIGH (ref 0–99)
Total CHOL/HDL Ratio: 4.1 Ratio
Triglycerides: 68 mg/dL (ref <150)
VLDL: 14 mg/dL (ref 0–40)

## 2015-03-14 LAB — CBC
HCT: 39.1 % (ref 36.0–46.0)
Hemoglobin: 13.3 g/dL (ref 12.0–15.0)
MCH: 30.7 pg (ref 26.0–34.0)
MCHC: 34 g/dL (ref 30.0–36.0)
MCV: 90.3 fL (ref 78.0–100.0)
MPV: 10.8 fL (ref 8.6–12.4)
PLATELETS: 389 10*3/uL (ref 150–400)
RBC: 4.33 MIL/uL (ref 3.87–5.11)
RDW: 13.9 % (ref 11.5–15.5)
WBC: 6.6 10*3/uL (ref 4.0–10.5)

## 2015-03-14 LAB — HEMOGLOBIN A1C
HEMOGLOBIN A1C: 6 % — AB (ref <5.7)
MEAN PLASMA GLUCOSE: 126 mg/dL — AB (ref <117)

## 2015-03-14 MED ORDER — IPRATROPIUM BROMIDE 0.03 % NA SOLN: 2.0000 | Freq: Four times a day (QID) | NASAL | Status: DC

## 2015-03-14 MED ORDER — PROMETHAZINE-DM 6.25-15 MG/5ML PO SYRP: 5.0000 mL | ORAL_SOLUTION | Freq: Four times a day (QID) | ORAL | Status: DC | PRN

## 2015-03-14 MED ORDER — BENAZEPRIL-HYDROCHLOROTHIAZIDE 20-12.5 MG PO TABS: 1.0000 | ORAL_TABLET | Freq: Every day | ORAL | Status: DC

## 2015-03-14 MED ORDER — DICYCLOMINE HCL 20 MG PO TABS: 20.0000 mg | ORAL_TABLET | Freq: Three times a day (TID) | ORAL | Status: DC

## 2015-03-14 NOTE — Patient Instructions (Signed)
Try adding the atrovent nasal spray (in place of or along with flonase) for post nasal drainage Try the bentyl with meals and at bedtime.  Let me know how this does for you; please contact me with an update in 1-2 weeks  I will be in touch with your labs asap
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
## 2015-03-29 ENCOUNTER — Encounter: Payer: Self-pay | Admitting: Family Medicine

## 2015-04-05 ENCOUNTER — Other Ambulatory Visit: Payer: Self-pay | Admitting: Family Medicine

## 2015-11-08 ENCOUNTER — Ambulatory Visit (INDEPENDENT_AMBULATORY_CARE_PROVIDER_SITE_OTHER): Payer: Federal, State, Local not specified - PPO

## 2015-11-08 ENCOUNTER — Ambulatory Visit (INDEPENDENT_AMBULATORY_CARE_PROVIDER_SITE_OTHER): Payer: Federal, State, Local not specified - PPO | Admitting: Family Medicine

## 2015-11-08 VITALS — BP 146/78 | HR 78 | Temp 98.8°F | Ht 61.5 in | Wt 160.0 lb

## 2015-11-08 DIAGNOSIS — R0781 Pleurodynia: Secondary | ICD-10-CM | POA: Diagnosis not present

## 2015-11-08 DIAGNOSIS — S20211A Contusion of right front wall of thorax, initial encounter: Secondary | ICD-10-CM

## 2015-11-08 MED ORDER — CYCLOBENZAPRINE HCL 10 MG PO TABS: 10.0000 mg | ORAL_TABLET | Freq: Two times a day (BID) | ORAL | Status: DC | PRN
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
## 2015-11-08 NOTE — Patient Instructions (Signed)
We are going to treat you for sore and bruised ribs with flexeril- a muscle relaxer.   Take it twice a day as needed- you might try a 1/2 pill first.  This can be sedating so do not use it when you need to drive.

## 2015-11-09 ENCOUNTER — Encounter: Payer: Self-pay | Admitting: Family Medicine

## 2015-11-25 ENCOUNTER — Encounter: Payer: Self-pay | Admitting: Family Medicine

## 2016-04-16 ENCOUNTER — Ambulatory Visit (INDEPENDENT_AMBULATORY_CARE_PROVIDER_SITE_OTHER): Payer: Federal, State, Local not specified - PPO | Admitting: Physician Assistant

## 2016-04-16 ENCOUNTER — Encounter: Payer: Self-pay | Admitting: Physician Assistant

## 2016-04-16 VITALS — BP 142/82 | HR 76 | Temp 98.3°F | Resp 16 | Ht 61.5 in | Wt 164.1 lb

## 2016-04-16 DIAGNOSIS — M542 Cervicalgia: Secondary | ICD-10-CM

## 2016-04-16 MED ORDER — TRAMADOL HCL 50 MG PO TABS: 50.0000 mg | ORAL_TABLET | Freq: Two times a day (BID) | ORAL | Status: DC | PRN

## 2016-04-16 NOTE — Progress Notes (Signed)
Pre visit review using our clinic review tool, if applicable. No additional management support is needed unless otherwise documented below in the visit note/SLS  
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
## 2016-04-16 NOTE — Patient Instructions (Signed)
Please take the Tramadol as directed with food for moderate to severe pain. If pain is mild you can substitute a tylenol arthritis.  Restart your Flexeril at night time. Apply topical Salon Pas to the area. Heating pad may be beneficial.  No heavy lifting or overexertion.  Again I do feel steroid would be very beneficial since we cannot use NSAIDs, but I understand your reservations.   FU if symptoms not improving over the next 72 hours. ER if anything acutely worsens.

## 2016-04-17 ENCOUNTER — Telehealth: Payer: Self-pay | Admitting: Family Medicine

## 2016-04-17 ENCOUNTER — Encounter: Payer: Self-pay | Admitting: Family Medicine

## 2016-04-17 ENCOUNTER — Ambulatory Visit (INDEPENDENT_AMBULATORY_CARE_PROVIDER_SITE_OTHER): Payer: Federal, State, Local not specified - PPO | Admitting: Family Medicine

## 2016-04-17 VITALS — BP 138/80 | HR 84 | Ht 62.0 in | Wt 164.0 lb

## 2016-04-17 DIAGNOSIS — M25512 Pain in left shoulder: Secondary | ICD-10-CM | POA: Diagnosis not present

## 2016-04-17 MED ORDER — METHYLPREDNISOLONE ACETATE 40 MG/ML IJ SUSP
40.0000 mg | Freq: Once | INTRAMUSCULAR | Status: AC
  Administered 2016-04-17: 40 mg via INTRA_ARTICULAR

## 2016-04-17 NOTE — Telephone Encounter (Signed)
Relationship to patient: self Can be reached: [phone]   Reason for call: Pt states "things are worse than they were yesterday" and that she would like to talk with Einar Pheasant if possible.

## 2016-04-17 NOTE — Telephone Encounter (Addendum)
FYI: Called patient to assess symptoms and discuss further treatment options; pt states that pain became so bad last evening that she did take the Tramadol for pain and early this morning she took another Tramadol & a Cyclobenzaprine, also she applied a Salon Pas Lidocaine patch to the affected area with out much relief. We discussed again what provider had suggested for her at [place] yesterday; steroid injection and Referral to Sports Medicine/Ortho, explained that the specialist are trained in that field of expertise and would be able to give her the care & treatment that she needs to move forward and begin the healing process; that the steroid injection would begin to calm things down and start pulling the inflammation out of the shoulder, and that if she felt comfortable with me that we could do a nurse visit and I would be glad to give her the injection. Explained also that if she agreed to see Sports Medicine [we talked about Dr. Barbaraann Barthel that I would call his nurse and speak with her about getting her in [as she was very concerned about being out of work last week for personal family issues and this week with her current condition]; pt agreed to let me call and get her an appointment with Dr. Barbaraann Barthel, spoke with Nevin Bloodgood and scheduled patient for 2:30pm today. Called patient back and informed her of appointment and she agreed; she will let Dr. Barbaraann Barthel decide if injections are needed and administer/SLS 06/13 Forwarded to PCP for FYI.

## 2016-04-17 NOTE — Patient Instructions (Signed)
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 You were given a cortisone injection today. Limit lifting overhead and no more than 15 pounds lifting over next week even if your motion recovers. Heat 15 minutes at a time 3-4 times a day. Tramadol, flexeril as needed. Salon pas as needed as well. Follow up with me in 1 month typically. You can repeat the intraarticular part of this injection if necessary. Physical therapy, MRI are other considerations if you don't improve as expected.

## 2016-04-17 NOTE — Telephone Encounter (Signed)
Please assess current symptoms. If any alarm symptoms are present I recommend ER assessment. As discussed yesterday she would benefit from a steroid to calm down nerve irritation but she declined. Can also try trial of Gabapentin to help with nerve pain.  We can consider imaging at this point if symptoms are worsening.

## 2016-04-18 ENCOUNTER — Encounter: Payer: Self-pay | Admitting: Family Medicine

## 2016-04-19 DIAGNOSIS — M25512 Pain in left shoulder: Secondary | ICD-10-CM | POA: Insufficient documentation

## 2016-04-19 NOTE — Assessment & Plan Note (Signed)
consistent with calcific tendinitis and adhesive capsulitis.  Shown home codman exercises to do daily.  Heat.  Tramadol, flexeril, salon pas as needed.  Combination injection given today as well.  Consider repeat intraarticular injection, PT, MRI depending on improvement.  F/u in 1 month.  After informed written consent, patient was seated on exam table. Left shoulder was prepped with alcohol swab and utilizing posterior approach, patient's left shoulder was injected with 6:2 marcaine:depomedrol with half in the subacromial space and half in glenohumeral space.  Patient tolerated the procedure well without immediate complications.
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
## 2016-04-27 ENCOUNTER — Other Ambulatory Visit: Payer: Self-pay | Admitting: Emergency Medicine

## 2016-04-27 DIAGNOSIS — I10 Essential (primary) hypertension: Secondary | ICD-10-CM

## 2016-04-27 MED ORDER — BENAZEPRIL-HYDROCHLOROTHIAZIDE 20-12.5 MG PO TABS: 1.0000 | ORAL_TABLET | Freq: Every day | ORAL | Status: DC

## 2016-04-30 ENCOUNTER — Encounter: Payer: Self-pay | Admitting: Family Medicine

## 2016-04-30 ENCOUNTER — Ambulatory Visit (INDEPENDENT_AMBULATORY_CARE_PROVIDER_SITE_OTHER): Payer: Federal, State, Local not specified - PPO | Admitting: Family Medicine

## 2016-04-30 ENCOUNTER — Other Ambulatory Visit: Payer: Self-pay | Admitting: Family Medicine

## 2016-04-30 VITALS — BP 137/65 | HR 69 | Temp 97.6°F | Ht 62.0 in | Wt 160.8 lb

## 2016-04-30 DIAGNOSIS — Z6379 Other stressful life events affecting family and household: Secondary | ICD-10-CM

## 2016-04-30 DIAGNOSIS — R7303 Prediabetes: Secondary | ICD-10-CM | POA: Diagnosis not present

## 2016-04-30 DIAGNOSIS — Z130 Encounter for screening for diseases of the blood and blood-forming organs and certain disorders involving the immune mechanism: Secondary | ICD-10-CM

## 2016-04-30 DIAGNOSIS — D75839 Thrombocytosis, unspecified: Secondary | ICD-10-CM

## 2016-04-30 DIAGNOSIS — Z1231 Encounter for screening mammogram for malignant neoplasm of breast: Secondary | ICD-10-CM

## 2016-04-30 DIAGNOSIS — Z119 Encounter for screening for infectious and parasitic diseases, unspecified: Secondary | ICD-10-CM

## 2016-04-30 DIAGNOSIS — Z0000 Encounter for general adult medical examination without abnormal findings: Secondary | ICD-10-CM | POA: Diagnosis not present

## 2016-04-30 DIAGNOSIS — I10 Essential (primary) hypertension: Secondary | ICD-10-CM | POA: Diagnosis not present

## 2016-04-30 DIAGNOSIS — E785 Hyperlipidemia, unspecified: Secondary | ICD-10-CM | POA: Diagnosis not present

## 2016-04-30 DIAGNOSIS — D473 Essential (hemorrhagic) thrombocythemia: Secondary | ICD-10-CM

## 2016-04-30 DIAGNOSIS — Z23 Encounter for immunization: Secondary | ICD-10-CM | POA: Diagnosis not present

## 2016-04-30 MED ORDER — BENAZEPRIL-HYDROCHLOROTHIAZIDE 20-12.5 MG PO TABS: 1.0000 | ORAL_TABLET | Freq: Every day | ORAL | Status: DC

## 2016-04-30 NOTE — Patient Instructions (Addendum)
It was good to see you today- I will be in touch with your labs asap I hope that your daughter is feeling better soon  Please stop by the imaging department downstairs and ask about mammogram options that will work with your shoulder problems.    Using tylenol for your foot is fine- you can use this as needed You might also go by the shoe market for some help with finding proper shoes for you
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
## 2016-04-30 NOTE — Progress Notes (Signed)
Pre visit review using our clinic review tool, if applicable. No additional management support is needed unless otherwise documented below in the visit note. 

## 2016-05-01 LAB — LIPID PANEL
CHOLESTEROL: 236 mg/dL — AB (ref 0–200)
HDL: 58.6 mg/dL (ref >39.00)
LDL CALC: 162 mg/dL — AB (ref 0–99)
NonHDL: 177.05
TRIGLYCERIDES: 76 mg/dL (ref 0.0–149.0)
Total CHOL/HDL Ratio: 4
VLDL: 15.2 mg/dL (ref 0.0–40.0)

## 2016-05-01 LAB — COMPREHENSIVE METABOLIC PANEL
ALBUMIN: 4.4 g/dL (ref 3.5–5.2)
ALK PHOS: 66 U/L (ref 39–117)
ALT: 21 U/L (ref 0–35)
AST: 21 U/L (ref 0–37)
BUN: 13 mg/dL (ref 6–23)
CHLORIDE: 100 meq/L (ref 96–112)
CO2: 31 mEq/L (ref 19–32)
Calcium: 9.5 mg/dL (ref 8.4–10.5)
Creatinine, Ser: 0.68 mg/dL (ref 0.40–1.20)
GFR: 90.73 mL/min (ref >60.00)
Glucose, Bld: 73 mg/dL (ref 70–99)
POTASSIUM: 3.7 meq/L (ref 3.5–5.1)
Sodium: 139 mEq/L (ref 135–145)
Total Bilirubin: 0.5 mg/dL (ref 0.2–1.2)
Total Protein: 7.2 g/dL (ref 6.0–8.3)

## 2016-05-01 LAB — CBC
HCT: 40.1 % (ref 36.0–46.0)
HEMOGLOBIN: 13.2 g/dL (ref 12.0–15.0)
MCHC: 33 g/dL (ref 30.0–36.0)
MCV: 92.5 fl (ref 78.0–100.0)
PLATELETS: 450 10*3/uL — AB (ref 150.0–400.0)
RBC: 4.34 Mil/uL (ref 3.87–5.11)
RDW: 14.5 % (ref 11.5–15.5)
WBC: 8.5 10*3/uL (ref 4.0–10.5)

## 2016-05-01 LAB — HEMOGLOBIN A1C: HEMOGLOBIN A1C: 5.9 % (ref 4.6–6.5)

## 2016-05-01 LAB — HEPATITIS C ANTIBODY: HCV Ab: NEGATIVE

## 2016-05-01 NOTE — Addendum Note (Signed)
Addended by: Lamar Blinks C on: 05/01/2016 03:12 PM   Modules accepted: Orders

## 2016-05-04 ENCOUNTER — Encounter: Payer: Self-pay | Admitting: Family Medicine

## 2016-05-15 ENCOUNTER — Ambulatory Visit (HOSPITAL_BASED_OUTPATIENT_CLINIC_OR_DEPARTMENT_OTHER)
Admission: RE | Admit: 2016-05-15 | Discharge: 2016-05-15 | Disposition: A | Discharge status: Home / Self Care | Payer: Federal, State, Local not specified - PPO | Source: Ambulatory Visit | Attending: Family Medicine | Admitting: Family Medicine

## 2016-05-15 DIAGNOSIS — Z1231 Encounter for screening mammogram for malignant neoplasm of breast: Secondary | ICD-10-CM | POA: Insufficient documentation

## 2016-10-12 ENCOUNTER — Encounter: Payer: Self-pay | Admitting: Family Medicine

## 2016-10-15 NOTE — Telephone Encounter (Signed)
Tried to contact pt to follow up on Lindsey Shepard. No answer, left voicemail for pt to call back.

## 2016-12-15 ENCOUNTER — Encounter: Payer: Self-pay | Admitting: Family Medicine

## 2016-12-24 NOTE — Telephone Encounter (Signed)
Relation to [id number] Call back number: [phone]   Reason for call:  Patient checking on the status of my chart message best # [phone] (H)
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
## 2017-05-01 ENCOUNTER — Ambulatory Visit (INDEPENDENT_AMBULATORY_CARE_PROVIDER_SITE_OTHER): Payer: Federal, State, Local not specified - PPO | Admitting: Family Medicine

## 2017-05-01 ENCOUNTER — Encounter: Payer: Self-pay | Admitting: Family Medicine

## 2017-05-01 VITALS — BP 142/88 | HR 68 | Temp 98.3°F | Ht 62.0 in | Wt 168.0 lb

## 2017-05-01 DIAGNOSIS — E782 Mixed hyperlipidemia: Secondary | ICD-10-CM | POA: Diagnosis not present

## 2017-05-01 DIAGNOSIS — I10 Essential (primary) hypertension: Secondary | ICD-10-CM | POA: Diagnosis not present

## 2017-05-01 DIAGNOSIS — Z5181 Encounter for therapeutic drug level monitoring: Secondary | ICD-10-CM

## 2017-05-01 DIAGNOSIS — Z0000 Encounter for general adult medical examination without abnormal findings: Secondary | ICD-10-CM | POA: Diagnosis not present

## 2017-05-01 DIAGNOSIS — R7303 Prediabetes: Secondary | ICD-10-CM

## 2017-05-01 DIAGNOSIS — E2839 Other primary ovarian failure: Secondary | ICD-10-CM

## 2017-05-01 DIAGNOSIS — Z6379 Other stressful life events affecting family and household: Secondary | ICD-10-CM | POA: Diagnosis not present

## 2017-05-01 DIAGNOSIS — Z23 Encounter for immunization: Secondary | ICD-10-CM

## 2017-05-01 LAB — LIPID PANEL
CHOL/HDL RATIO: 4
Cholesterol: 228 mg/dL — ABNORMAL HIGH (ref 0–200)
HDL: 51.1 mg/dL (ref >39.00)
LDL Cholesterol: 158 mg/dL — ABNORMAL HIGH (ref 0–99)
NONHDL: 176.82
TRIGLYCERIDES: 93 mg/dL (ref 0.0–149.0)
VLDL: 18.6 mg/dL (ref 0.0–40.0)

## 2017-05-01 LAB — COMPREHENSIVE METABOLIC PANEL
ALBUMIN: 4.3 g/dL (ref 3.5–5.2)
ALK PHOS: 58 U/L (ref 39–117)
ALT: 16 U/L (ref 0–35)
AST: 17 U/L (ref 0–37)
BILIRUBIN TOTAL: 0.5 mg/dL (ref 0.2–1.2)
BUN: 15 mg/dL (ref 6–23)
CO2: 33 mEq/L — ABNORMAL HIGH (ref 19–32)
Calcium: 9.5 mg/dL (ref 8.4–10.5)
Chloride: 102 mEq/L (ref 96–112)
Creatinine, Ser: 0.8 mg/dL (ref 0.40–1.20)
GFR: 75 mL/min (ref >60.00)
GLUCOSE: 101 mg/dL — AB (ref 70–99)
POTASSIUM: 3.6 meq/L (ref 3.5–5.1)
Sodium: 140 mEq/L (ref 135–145)
TOTAL PROTEIN: 6.7 g/dL (ref 6.0–8.3)

## 2017-05-01 LAB — CBC
HEMATOCRIT: 39.6 % (ref 36.0–46.0)
HEMOGLOBIN: 13.2 g/dL (ref 12.0–15.0)
MCHC: 33.5 g/dL (ref 30.0–36.0)
MCV: 92.8 fl (ref 78.0–100.0)
Platelets: 377 10*3/uL (ref 150.0–400.0)
RBC: 4.27 Mil/uL (ref 3.87–5.11)
RDW: 15.1 % (ref 11.5–15.5)
WBC: 7.1 10*3/uL (ref 4.0–10.5)

## 2017-05-01 LAB — HEMOGLOBIN A1C: HEMOGLOBIN A1C: 6 % (ref 4.6–6.5)

## 2017-05-01 MED ORDER — BENAZEPRIL-HYDROCHLOROTHIAZIDE 20-12.5 MG PO TABS: 1.0000 | ORAL_TABLET | Freq: Every day | ORAL | 3 refills | Status: DC

## 2017-05-01 NOTE — Patient Instructions (Addendum)
It was good to see you today- I am sorry that things have been so hard for your family.  Let me know if I can do anything to help or if you would like to see a counselor I will be in touch with your labs and we will arrange for you to have a bone density exam

## 2017-05-06 ENCOUNTER — Ambulatory Visit (HOSPITAL_BASED_OUTPATIENT_CLINIC_OR_DEPARTMENT_OTHER)
Admission: RE | Admit: 2017-05-06 | Discharge: 2017-05-06 | Disposition: A | Discharge status: Home / Self Care | Payer: Federal, State, Local not specified - PPO | Source: Ambulatory Visit | Attending: Family Medicine | Admitting: Family Medicine

## 2017-05-06 ENCOUNTER — Other Ambulatory Visit: Payer: Self-pay | Admitting: Family Medicine

## 2017-05-06 DIAGNOSIS — M8589 Other specified disorders of bone density and structure, multiple sites: Secondary | ICD-10-CM | POA: Diagnosis not present

## 2017-05-06 DIAGNOSIS — I10 Essential (primary) hypertension: Secondary | ICD-10-CM

## 2017-05-06 DIAGNOSIS — E2839 Other primary ovarian failure: Secondary | ICD-10-CM

## 2017-05-07 ENCOUNTER — Encounter: Payer: Self-pay | Admitting: Family Medicine

## 2017-05-07 DIAGNOSIS — M8580 Other specified disorders of bone density and structure, unspecified site: Secondary | ICD-10-CM | POA: Insufficient documentation

## 2017-05-09 ENCOUNTER — Encounter: Payer: Self-pay | Admitting: Family Medicine

## 2017-08-29 ENCOUNTER — Other Ambulatory Visit: Payer: Self-pay | Admitting: Emergency Medicine

## 2017-11-15 ENCOUNTER — Encounter: Payer: Self-pay | Admitting: Family Medicine

## 2017-11-19 ENCOUNTER — Encounter: Payer: Self-pay | Admitting: Family Medicine
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
## 2017-11-21 ENCOUNTER — Ambulatory Visit (INDEPENDENT_AMBULATORY_CARE_PROVIDER_SITE_OTHER): Payer: Federal, State, Local not specified - PPO | Admitting: Family Medicine

## 2017-11-21 ENCOUNTER — Encounter: Payer: Self-pay | Admitting: Family Medicine

## 2017-11-21 VITALS — BP 150/90 | HR 75 | Temp 98.4°F | Ht 62.0 in | Wt 169.0 lb

## 2017-11-21 DIAGNOSIS — I10 Essential (primary) hypertension: Secondary | ICD-10-CM

## 2017-11-21 DIAGNOSIS — L309 Dermatitis, unspecified: Secondary | ICD-10-CM | POA: Diagnosis not present

## 2017-11-21 DIAGNOSIS — Z6379 Other stressful life events affecting family and household: Secondary | ICD-10-CM

## 2017-11-21 MED ORDER — PREDNISONE 20 MG PO TABS: ORAL_TABLET | ORAL | 0 refills | Status: DC

## 2017-11-21 NOTE — Patient Instructions (Addendum)
Please try the prednisone 20 mg a day for 5 days. I also gave you some samples of xyzal Please let me know if this is not helping with your rash Topically, I would apply aquaphor when you can.  If this is too greasy cetaphil cream may be a good compromise

## 2017-11-28 ENCOUNTER — Ambulatory Visit (INDEPENDENT_AMBULATORY_CARE_PROVIDER_SITE_OTHER): Payer: Federal, State, Local not specified - PPO | Admitting: Medical

## 2017-11-28 ENCOUNTER — Ambulatory Visit: Payer: Self-pay | Admitting: *Deleted

## 2017-11-28 ENCOUNTER — Encounter: Payer: Self-pay | Admitting: Medical

## 2017-11-28 VITALS — BP 185/90 | HR 76 | Temp 98.2°F | Resp 16 | Ht 62.0 in | Wt 171.0 lb

## 2017-11-28 DIAGNOSIS — J029 Acute pharyngitis, unspecified: Secondary | ICD-10-CM | POA: Diagnosis not present

## 2017-11-28 DIAGNOSIS — I10 Essential (primary) hypertension: Secondary | ICD-10-CM

## 2017-11-28 DIAGNOSIS — R5383 Other fatigue: Secondary | ICD-10-CM

## 2017-11-28 DIAGNOSIS — R42 Dizziness and giddiness: Secondary | ICD-10-CM | POA: Diagnosis not present

## 2017-11-28 DIAGNOSIS — H81399 Other peripheral vertigo, unspecified ear: Secondary | ICD-10-CM

## 2017-11-28 MED ORDER — AMLODIPINE BESYLATE 10 MG PO TABS: 10.0000 mg | ORAL_TABLET | Freq: Every day | ORAL | 0 refills | Status: DC

## 2017-11-28 NOTE — Patient Instructions (Addendum)
  With your recent severe high blood pressure and dizziness, we prescribed amlodipine take 1 tab daily.  Please start amlodipine today and continue your other blood pressure medication.  I did discuss with Dr. Lorelei Pont the approach of getting CT of head stat and lab work.  She was in agreement with the plan.  I do respect your choice not to get test done but please be cautious and if you have any cardiac or neurologic signs or symptoms then be seen in the emergency department.  I do want you to schedule nurse blood pressure check for tomorrow to verify your blood pressure has come down some and to see how you are feeling.  For your recent mild nasal congestion would use your Flonase.  If you have worse sinus pressure or more mucus blowing from your nose then you could contact us and I would write you an antibiotic.  Follow-up tomorrow for nurse blood pressure check or as needed.  I also am prescribing you blood pressure cuff check your blood pressure daily over the next week.  I would also asked that she schedule a office visit next week so we can confirm that your blood pressures have come back down to your former levels.  Recommend early 8-9 am appointment or 1-2 pm to minimize wait  time.

## 2017-11-28 NOTE — Progress Notes (Signed)
poct

## 2017-11-28 NOTE — Telephone Encounter (Signed)
Appt w/ Edward today.  

## 2017-11-28 NOTE — Telephone Encounter (Signed)
Pt having some dizziness this morning. Not sure what is causing this. Some sinus pressure reported. No headache, nausea or vomiting, just a feeling of being tired afterwards. She does have HTN and on b/p meds and unable to check her  b/p. She says her b/p feels like it is up.  Appointment made for today. Home care advice given to patient.  Reason for Disposition . [1] NO dizziness now AND [2] one or more stroke risk factors (i.e., hypertension, diabetes, prior stroke/TIA/heart attack)  Answer Assessment - Initial Assessment Questions 1. DESCRIPTION: "Describe your dizziness."     Room spinning 2. VERTIGO: "Do you feel like either you or the room is spinning or tilting?"      Room spinning and tilting 3. LIGHTHEADED: "Do you feel lightheaded?" (e.g., somewhat faint, woozy, weak upon standing)     Feels tired 4. SEVERITY: "How bad is it?"  "Can you walk?"   - MILD - Feels unsteady but walking normally.   - MODERATE - Feels very unsteady when walking, but not falling; interferes with normal activities (e.g., school, work) .   - SEVERE - Unable to walk without falling (requires assistance).     Severe at the time 5. ONSET:  "When did the dizziness begin?"     This morning 6. AGGRAVATING FACTORS: "Does anything make it worse?" (e.g., standing, change in head position)     no 7. CAUSE: "What do you think is causing the dizziness?"     Not sure 8. RECURRENT SYMPTOM: "Have you had dizziness before?" If so, ask: "When was the last time?" "What happened that time?"     no 9. OTHER SYMPTOMS: "Do you have any other symptoms?" (e.g., headache, weakness, numbness, vomiting, earache)     Sinus pressure at the time 10. PREGNANCY: "Is there any chance you are pregnant?" "When was your last menstrual period?"       n/a  Protocols used: DIZZINESS - VERTIGO-A-AH

## 2017-11-28 NOTE — Progress Notes (Signed)
Subjective:    Patient ID: Lindsey Shepard, female    DOB: 03/03/1946, 72 y.o.   MRN: [id number]  HPI  Pt in stating this morning on way to work she felt some dizziness. Pt states in a rush to get to work. She notes that last night she took tablet for sinus that Dr Lorelei Pont gave her. Pt took xyzal(no decongestant).  She wonders if there might be a correlation with Xyzal use and her symptoms today.  However she does stand note she is used before and never had any problems.  Before leaving for work got very dizzy as she turned real quickly. Felt sensation of room was spinning and she had to lay down on her bed. The room spinning sensation did taper off. She eventually got up and felt better but did feel lingering faint light headed sensation.  Pt has been on prednisone recently for rash. Her arm looks better.  On review no ha, no vision changes, no slurred speech, and no gross motor or sensory function deficits. Pt bp is high today when cma checked and when I checked.  She does not have a blood pressure cuff at home.     Review of Systems  Constitutional: Positive for fatigue.  HENT: Positive for congestion, sinus pressure, sinus pain and sore throat.        States slight pressure over throat. Faint pain on swallowing.  Some nasal congestion and mild sinus pain recntly. Some mucous that she blew out. The thinks dizziness may be related to her sinus.  Respiratory: Negative for cough, chest tightness, shortness of breath and wheezing.   Cardiovascular: Negative for chest pain and palpitations.  Gastrointestinal: Negative for abdominal pain, blood in stool, nausea and rectal pain.  Musculoskeletal: Negative for back pain, myalgias and neck pain.  Neurological: Positive for dizziness. Negative for syncope, speech difficulty, weakness, light-headedness and numbness.       See hpi.  Hematological: Negative for adenopathy. Does not bruise/bleed easily.  Psychiatric/Behavioral: Negative for  behavioral problems, confusion and sleep disturbance. The patient is not nervous/anxious.        Stressed some about slight dizziness.  She would like to know because of her dizziness    Past Medical History:  Diagnosis Date  . Anxiety   . Arthritis   . Diverticulitis   . Hyperlipidemia   . Hypertension   . Other abnormal clinical finding    mirco preforation with diverticulitis  . 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     Social History   Socioeconomic History  . Marital status: Married    Spouse name: Not on file  . Number of children: Not on file  . Years of education: Not on file  . Highest education level: Not on file  Social Needs  . Financial resource strain: Not on file  . Food insecurity - worry: Not on file  . Food insecurity - inability: Not on file  . Transportation needs - medical: Not on file  . Transportation needs - non-medical: Not on file  Occupational History  . Not on file  Tobacco Use  . Smoking status: Never Smoker  . Smokeless tobacco: Never Used  Substance and Sexual Activity  . Alcohol use: Yes    Alcohol/week: 2.4 oz    Types: 4 Glasses of wine per week    Comment: 1 glass/day  . Drug use: No  . Sexual activity: Not on file  Other Topics Concern  . Not on file  Social History Narrative  . Not on file    Past Surgical History:  Procedure Laterality Date  . ABDOMINAL HYSTERECTOMY    . fracture left arm     with hardware  . left arm surgery     reflex sympathetic dystrophy- 9 surgeries  . NEPHRECTOMY     right    Family History  Problem Relation Age of Onset  . Heart disease Mother   . Diabetes Father   . Pancreatic cancer Father   . Heart disease Brother     Allergies  Allergen Reactions  . Hydrocodone-Acetaminophen Nausea Only  . Motrin [Ibuprofen] Swelling  . Percocet [Oxycodone-Acetaminophen] Nausea And Vomiting  . Vicodin [Hydrocodone-Acetaminophen] Nausea Only    Current Outpatient Medications on File Prior to Visit    Medication Sig Dispense Refill  . Acetaminophen (TYLENOL ARTHRITIS PAIN PO) Take 1 tablet by mouth as needed.    . benazepril-hydrochlorthiazide (LOTENSIN HCT) 20-12.5 MG tablet Take 1 tablet by mouth daily. 90 tablet 3  . cetirizine (ZYRTEC) 10 MG tablet Take 10 mg by mouth as needed for allergies.    Marland Kitchen dicyclomine (BENTYL) 20 MG tablet Take 20 mg by mouth as needed for spasms.    Marland Kitchen glucosamine-chondroitin 500-400 MG tablet Take 1 tablet by mouth daily.    . Multiple Vitamins-Minerals (MULTIVITAMIN WITH MINERALS) tablet Take 3 tablets by mouth daily.    . Omega-3 Fatty Acids (FISH OIL) 1000 MG CAPS 1 TAB DAILY    . OVER THE COUNTER MEDICATION OTC Calcium and Magnesium taking 2 tabs daily     No current facility-administered medications on file prior to visit.     BP (!) 183/80   Pulse 76   Temp 98.2 F (36.8 C) (Oral)   Resp 16   Ht 5\' 2"  (1.575 m)   Wt 171 lb (77.6 kg)   SpO2 98%   BMI 31.28 kg/m       Objective:   Physical Exam  General  Mental Status - Alert. General Appearance - Well groomed. Not in acute distress.  Skin Rashes- No Rashes.  HEENT Head- Normal. Ear Auditory Canal - Left- Normal. Right - Normal.Tympanic Membrane- Left- Normal. Right- Normal. Eye Sclera/Conjunctiva- Left- Normal. Right- Normal. Nose & Sinuses Nasal Mucosa- Left-  Boggy and Congested. Right-  Boggy and  Congested.Bilateral no maxillary and no frontal sinus pressure. Mouth & Throat Lips: Upper Lip- Normal: no dryness, cracking, pallor, cyanosis, or vesicular eruption. Lower Lip-Normal: no dryness, cracking, pallor, cyanosis or vesicular eruption. Buccal Mucosa- Bilateral- No Aphthous ulcers. Oropharynx- No Discharge or Erythema. Tonsils: Characteristics- Bilateral- No Erythema or Congestion. Size/Enlargement- Bilateral- No enlargement. Discharge- bilateral-None.  Neck Neck- Supple. No Masses.  On palpation over anterior neck no obvious thyromegaly  Chest and Lung  Exam Auscultation: Breath Sounds:-Clear even and unlabored.  Cardiovascular Auscultation:Rythm- Regular, rate and rhythm. Murmurs & Other Heart Sounds:Ausculatation of the heart reveal- No Murmurs.  Lymphatic Head & Neck General Head & Neck Lymphatics: Bilateral: Description- No Localized lymphadenopathy.   Neurologic Cranial Nerve exam:- CN III-XII intact(No nystagmus), symmetric smile. Drift Test:- No drift. Romberg Exam:- Negative.  Heal to Toe Gait exam:-mild difficulty. Heels did not meet toe. Appeared some off balance Finger to Nose:- Normal/Intact Strength:- 5/5 equal and symmetric strength both upper and lower extremities.      Assessment & Plan:   With your recent severe high blood pressure and dizziness, we prescribed amlodipine take 1 tab daily.  Please start amlodipine today and continue your other blood  pressure medication.  I did discuss with Dr. Lorelei Pont the approach of getting CT of head stat and lab work.  She was in agreement with the plan.  I do respect your choice not to get test done but please be cautious and if you have any cardiac or neurologic signs or symptoms then be seen in the emergency department.  I do want you to schedule nurse blood pressure check for tomorrow to verify your blood pressure has come down some and to see how you are feeling.  For your recent mild nasal congestion would use your Flonase.  If you have worse sinus pressure or more mucus blowing from your nose then you could contact us and I would write you an antibiotic.(Patient describes reluctance to take antibiotic and let us clearly indicated)  Follow-up tomorrow for nurse blood pressure check or as needed.  I also am prescribing you blood pressure cuff check your blood pressure daily over the next week.  I would also asked that she schedule a office visit next week so we can confirm that your blood pressures have come back down to your former levels.  Recommend early 8-9 am  appointment or 1-2 pm to minimize wait time.  40-minute total time spent with patient.  50% of time spent with patient counseling her on on why I wanted to work her up fairly aggressively.  And discussion on plan and coordinating a plan since she declined CT of head and lab work.  In addition discussed with her PCP patient's presentation and made her aware of current condition.  Anayla Giannetti, Percell Miller, PA-C

## 2017-11-29 ENCOUNTER — Ambulatory Visit (INDEPENDENT_AMBULATORY_CARE_PROVIDER_SITE_OTHER): Payer: Federal, State, Local not specified - PPO | Admitting: Medical

## 2017-11-29 VITALS — BP 159/78 | HR 78

## 2017-11-29 DIAGNOSIS — I10 Essential (primary) hypertension: Secondary | ICD-10-CM | POA: Diagnosis not present

## 2017-11-29 NOTE — Progress Notes (Addendum)
Pre visit review using our clinic tool,if applicable. No additional management support is needed unless otherwise documented below in the visit note.   Patient in for BP check per order from E. Saguier, PA-C due to BP elevation on last visit. Started on Amlodipine 10 mg however patient just took first dose this morning. Also taking Lotensin-HCT 20-12.5 mg   Patient complains that she continues to have headache today. Had nosebleed yesterday after visit . States she did feel relief after this and it has not bled thus far today.  BP today = 159/78 P= 78  Per E. Saguier patient to see provider in 1 week. Appointment scheduled with Dr. Lorelei Pont.   Patient had much higher blood pressures yesterday.  Blood pressure did increase.  I did talk with her briefly and she was still describing some faint frontal and maxillary sinus pressure.  She admits that she did not start the Flonase as I recommended.  She still is hesitant to be on any antibiotics.  She reported no gross motor or neurologic deficits.  She was reporting some ankle pain and we discussed using Tylenol for that since and said would make her blood pressure go up.  She expressed understanding.  Also got 2 inch Ace wrap and wrapped her ankle/foot area to help decrease her pain.  Patient was just in for blood pressure recheck by nurse.  He did touch base with other regarding her sinuses again and her new report of right ankle pain.  Instructed patient to follow-up in 1 week with Dr. Lorelei Pont to recheck her blood pressure again.  Mackie Pai, PA-C
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
## 2017-12-05 ENCOUNTER — Encounter: Payer: Self-pay | Admitting: Family Medicine

## 2017-12-05 ENCOUNTER — Ambulatory Visit: Payer: Federal, State, Local not specified - PPO | Admitting: Family Medicine

## 2017-12-05 VITALS — BP 138/70 | HR 85 | Temp 97.7°F | Wt 169.8 lb

## 2017-12-05 DIAGNOSIS — E782 Mixed hyperlipidemia: Secondary | ICD-10-CM | POA: Diagnosis not present

## 2017-12-05 DIAGNOSIS — R7303 Prediabetes: Secondary | ICD-10-CM | POA: Diagnosis not present

## 2017-12-05 DIAGNOSIS — I10 Essential (primary) hypertension: Secondary | ICD-10-CM | POA: Diagnosis not present

## 2017-12-05 DIAGNOSIS — R42 Dizziness and giddiness: Secondary | ICD-10-CM | POA: Diagnosis not present

## 2017-12-05 NOTE — Patient Instructions (Addendum)
Your BP looks fine on your current regimen  I will get labs for you today and also set up carotid dopplers for you today  Please let me know how you are progressing over the next few days/ weeks.  If you are not doing ok let me know right away

## 2017-12-06 ENCOUNTER — Encounter: Payer: Self-pay | Admitting: Family Medicine

## 2017-12-06 LAB — COMPREHENSIVE METABOLIC PANEL
ALK PHOS: 64 U/L (ref 39–117)
ALT: 24 U/L (ref 0–35)
AST: 22 U/L (ref 0–37)
Albumin: 4.2 g/dL (ref 3.5–5.2)
BUN: 21 mg/dL (ref 6–23)
CO2: 28 mEq/L (ref 19–32)
CREATININE: 0.73 mg/dL (ref 0.40–1.20)
Calcium: 8.7 mg/dL (ref 8.4–10.5)
Chloride: 104 mEq/L (ref 96–112)
GFR: 83.22 mL/min (ref >60.00)
GLUCOSE: 105 mg/dL — AB (ref 70–99)
Potassium: 4.2 mEq/L (ref 3.5–5.1)
SODIUM: 139 meq/L (ref 135–145)
TOTAL PROTEIN: 6.9 g/dL (ref 6.0–8.3)
Total Bilirubin: 0.3 mg/dL (ref 0.2–1.2)

## 2017-12-06 LAB — CBC
HCT: 40.1 % (ref 36.0–46.0)
HEMOGLOBIN: 13.3 g/dL (ref 12.0–15.0)
MCHC: 33.3 g/dL (ref 30.0–36.0)
MCV: 93.1 fl (ref 78.0–100.0)
Platelets: 424 10*3/uL — ABNORMAL HIGH (ref 150.0–400.0)
RBC: 4.31 Mil/uL (ref 3.87–5.11)
RDW: 14.7 % (ref 11.5–15.5)
WBC: 7.9 10*3/uL (ref 4.0–10.5)

## 2017-12-06 LAB — HEMOGLOBIN A1C: Hgb A1c MFr Bld: 6.2 % (ref 4.6–6.5)

## 2017-12-10 ENCOUNTER — Encounter: Payer: Self-pay | Admitting: Family Medicine

## 2017-12-21 ENCOUNTER — Encounter: Payer: Self-pay | Admitting: Family Medicine

## 2017-12-23 MED ORDER — AMLODIPINE BESYLATE 10 MG PO TABS: 10.0000 mg | ORAL_TABLET | Freq: Every day | ORAL | 3 refills | Status: DC

## 2017-12-24 ENCOUNTER — Ambulatory Visit
Admission: RE | Admit: 2017-12-24 | Discharge: 2017-12-24 | Disposition: A | Discharge status: Home / Self Care | Payer: Federal, State, Local not specified - PPO | Source: Ambulatory Visit | Attending: Family Medicine | Admitting: Family Medicine

## 2017-12-24 ENCOUNTER — Encounter: Payer: Self-pay | Admitting: Family Medicine

## 2017-12-24 DIAGNOSIS — R42 Dizziness and giddiness: Secondary | ICD-10-CM

## 2017-12-26 ENCOUNTER — Encounter: Payer: Self-pay | Admitting: Family Medicine

## 2018-01-01 ENCOUNTER — Encounter: Payer: Self-pay | Admitting: Family Medicine

## 2018-01-12 ENCOUNTER — Encounter: Payer: Self-pay | Admitting: Family Medicine

## 2018-01-15 ENCOUNTER — Encounter: Payer: Self-pay | Admitting: Family Medicine

## 2018-01-15 ENCOUNTER — Ambulatory Visit: Payer: Federal, State, Local not specified - PPO | Admitting: Family Medicine

## 2018-01-15 ENCOUNTER — Ambulatory Visit (HOSPITAL_BASED_OUTPATIENT_CLINIC_OR_DEPARTMENT_OTHER)
Admission: RE | Admit: 2018-01-15 | Discharge: 2018-01-15 | Disposition: A | Discharge status: Home / Self Care | Payer: Federal, State, Local not specified - PPO | Source: Ambulatory Visit | Attending: Family Medicine | Admitting: Family Medicine

## 2018-01-15 ENCOUNTER — Other Ambulatory Visit: Payer: Self-pay | Admitting: Family Medicine

## 2018-01-15 VITALS — BP 132/75 | HR 83 | Temp 97.6°F | Wt 169.8 lb

## 2018-01-15 DIAGNOSIS — M7989 Other specified soft tissue disorders: Secondary | ICD-10-CM | POA: Diagnosis not present

## 2018-01-15 DIAGNOSIS — I868 Varicose veins of other specified sites: Secondary | ICD-10-CM | POA: Diagnosis not present

## 2018-01-15 DIAGNOSIS — M79604 Pain in right leg: Secondary | ICD-10-CM | POA: Diagnosis present

## 2018-01-15 DIAGNOSIS — I10 Essential (primary) hypertension: Secondary | ICD-10-CM

## 2018-01-15 DIAGNOSIS — M25474 Effusion, right foot: Secondary | ICD-10-CM | POA: Diagnosis not present

## 2018-01-15 DIAGNOSIS — M79671 Pain in right foot: Secondary | ICD-10-CM

## 2018-01-15 LAB — BASIC METABOLIC PANEL
BUN: 13 mg/dL (ref 6–23)
CALCIUM: 10.5 mg/dL (ref 8.4–10.5)
CHLORIDE: 102 meq/L (ref 96–112)
CO2: 30 meq/L (ref 19–32)
Creatinine, Ser: 0.75 mg/dL (ref 0.40–1.20)
GFR: 80.64 mL/min (ref >60.00)
Glucose, Bld: 116 mg/dL — ABNORMAL HIGH (ref 70–99)
Potassium: 5 mEq/L (ref 3.5–5.1)
SODIUM: 142 meq/L (ref 135–145)

## 2018-01-15 LAB — URIC ACID: Uric Acid, Serum: 5.1 mg/dL (ref 2.4–7.0)

## 2018-01-15 MED ORDER — COLCHICINE 0.6 MG PO TABS: ORAL_TABLET | ORAL | 0 refills | Status: DC
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
## 2018-01-15 NOTE — Patient Instructions (Signed)
I wonder if you may have gout in your foot vs a possible blood clot.    Please come in for x-rays of your foot and a doppler of your right leg at 11:30 am at imaging services on the ground floor.  We will also get your labs back today as well so we can make a plan  Your BP looks ok!  Continue current medications

## 2018-01-18 ENCOUNTER — Encounter: Payer: Self-pay | Admitting: Family Medicine

## 2018-01-20 ENCOUNTER — Encounter: Payer: Self-pay | Admitting: Family Medicine

## 2018-01-21 ENCOUNTER — Encounter: Payer: Self-pay | Admitting: Family Medicine

## 2018-01-29 ENCOUNTER — Encounter: Payer: Self-pay | Admitting: Family Medicine

## 2018-01-29 DIAGNOSIS — I10 Essential (primary) hypertension: Secondary | ICD-10-CM

## 2018-01-30 MED ORDER — BENAZEPRIL-HYDROCHLOROTHIAZIDE 20-12.5 MG PO TABS: 2.0000 | ORAL_TABLET | Freq: Every day | ORAL | 3 refills | Status: DC

## 2018-02-01 ENCOUNTER — Encounter: Payer: Self-pay | Admitting: Family Medicine

## 2018-02-05 ENCOUNTER — Encounter: Payer: Self-pay | Admitting: Family Medicine

## 2018-02-05 DIAGNOSIS — M79671 Pain in right foot: Secondary | ICD-10-CM

## 2018-02-12 ENCOUNTER — Encounter: Payer: Self-pay | Admitting: Family Medicine

## 2018-02-13 ENCOUNTER — Encounter: Payer: Self-pay | Admitting: Family Medicine

## 2018-02-14 ENCOUNTER — Encounter: Payer: Self-pay | Admitting: Family Medicine

## 2018-02-20 ENCOUNTER — Ambulatory Visit: Payer: Federal, State, Local not specified - PPO | Admitting: Family Medicine

## 2018-02-20 ENCOUNTER — Encounter: Payer: Self-pay | Admitting: Family Medicine

## 2018-02-20 DIAGNOSIS — M79671 Pain in right foot: Secondary | ICD-10-CM

## 2018-02-20 NOTE — Patient Instructions (Signed)
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 This is likely causing compensatory pain in other parts of your foot and up your leg. These are the different medications you can take for this: Tylenol 500mg  1-2 tabs three times a day for pain. Capsaicin, aspercreme, or biofreeze topically up to four times a day may also help with pain. Some supplements that may help for arthritis: Boswellia extract, curcumin, pycnogenol Aleve 1-2 tabs twice a day with food as needed. Obviously with your GI system you have to be careful with the tylenol and the aleve. Arch supports are very important - something like dr. Zoe Lan active series, spencos, superfeet, or our green insoles. Avoid flat shoes, barefoot walking as much as possible. theraband strengthening exercises 3 sets of 10 once a day each direction. Consider walking boot, physical therapy, custom orthotics if not improving. Follow up with me in 1 month.

## 2018-02-22 ENCOUNTER — Encounter: Payer: Self-pay | Admitting: Family Medicine

## 2018-02-22 DIAGNOSIS — M79671 Pain in right foot: Secondary | ICD-10-CM | POA: Insufficient documentation

## 2018-02-22 NOTE — Assessment & Plan Note (Signed)
patient describing fairly diffuse pain.  She currently has evidence of extensor digitorum and peroneal tendinitis/tenosynovitis.  Discussed tylenol, topical medications, aleve.  Stressed importance of arch supports and discussed brands that tend to help.  Avoid flat shoes, barefoot walking.  Reviewed home exercise program.  Consider walking boot, physical therapy, custom orthotics if not improving.  F/u in 1 month.

## 2018-02-22 NOTE — Progress Notes (Signed)
PCP and consultation requested by: Copland, Gay Filler, MD  Subjective:   HPI: Patient is a 72 y.o. female here for right foot pain.  Patient reports starting in January she's had pain, swelling of her foot. Worse by end of the day. Seemed to start with pain in right big toe and nail bed, moved to 2nd digit and now primarily bothering her dorsally and laterally with some soreness plantar foot. Pain level 3/10 but up to 8/10 sharp at times. Worse with ambulation. Tylenol used to help. She also tried prednisone back in march and advil. Wearing new balance shoes. No skin changes, numbness.  Past Medical History:  Diagnosis Date  . Anxiety   . Arthritis   . Diverticulitis   . Hyperlipidemia   . Hypertension   . Other abnormal clinical finding    mirco preforation with diverticulitis  . 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 acetaminophen (TYLENOL) 325 MG tablet Take 650 mg by mouth every 6 (six) hours as needed.    . benazepril-hydrochlorthiazide (LOTENSIN HCT) 20-12.5 MG tablet Take 2 tablets by mouth daily. 180 tablet 3  . colchicine 0.6 MG tablet Take 2 pills once then take 1 pill an hour later 10 tablet 0  . dicyclomine (BENTYL) 20 MG tablet Take 20 mg by mouth as needed for spasms.    Marland Kitchen glucosamine-chondroitin 500-400 MG tablet Take 1 tablet by mouth daily.    . Multiple Vitamins-Minerals (MULTIVITAMIN WITH MINERALS) tablet Take 3 tablets by mouth daily.    . Omega-3 Fatty Acids (FISH OIL) 1000 MG CAPS 1 TAB DAILY    . OVER THE COUNTER MEDICATION OTC Calcium and Magnesium taking 2 tabs daily     No current facility-administered medications on file prior to visit.     Past Surgical History:  Procedure Laterality Date  . ABDOMINAL HYSTERECTOMY    . fracture left arm     with hardware  . left arm surgery     reflex sympathetic dystrophy- 9 surgeries  . NEPHRECTOMY     right    Allergies   Allergen Reactions  . Hydrocodone-Acetaminophen Nausea Only  . Motrin [Ibuprofen] Swelling  . Percocet [Oxycodone-Acetaminophen] Nausea And Vomiting  . Vicodin [Hydrocodone-Acetaminophen] Nausea Only    Social History   Socioeconomic History  . Marital status: Widowed    Spouse name: Not on file  . Number of children: Not on file  . Years of education: Not on file  . Highest education level: Not on file  Occupational History  . Not on file  Social Needs  . Financial resource strain: Not on file  . Food insecurity:    Worry: Not on file    Inability: Not on file  . Transportation needs:    Medical: Not on file    Non-medical: Not on file  Tobacco Use  . Smoking status: Never Smoker  . Smokeless tobacco: Never Used  Substance and Sexual Activity  . Alcohol use: Yes    Alcohol/week: 2.4 oz    Types: 4 Glasses of wine per week    Comment: 1 glass/day  . Drug use: No  . Sexual activity: Not on file  Lifestyle  . Physical activity:    Days per week: Not on file    Minutes per session: Not on file  . Stress: Not on file  Relationships  . Social connections:    Talks on  phone: Not on file    Gets together: Not on file    Attends religious service: Not on file    Active member of club or organization: Not on file    Attends meetings of clubs or organizations: Not on file    Relationship status: Not on file  . Intimate partner violence:    Fear of current or ex partner: Not on file    Emotionally abused: Not on file    Physically abused: Not on file    Forced sexual activity: Not on file  Other Topics Concern  . Not on file  Social History Narrative  . Not on file    Family History  Problem Relation Age of Onset  . Heart disease Mother   . Diabetes Father   . Pancreatic cancer Father   . Heart disease Brother     BP (!) 160/82   Pulse 77   Ht 5\' 2"  (1.575 m)   Wt 169 lb (76.7 kg)   BMI 30.91 kg/m   Review of Systems: See HPI above.     Objective:   Physical Exam:  Gen: NAD, comfortable in exam room  Right foot/ankle: No gross deformity, swelling, ecchymoses FROM with pain on dorsiflexion.  5/5 strength all directions. TTP mildly over extensor digitorum, peroneal tendons.  No other tenderness currently. Negative ant drawer and talar tilt.   Negative syndesmotic compression. Thompsons test negative. NV intact distally.  Left foot/ankle: No deformity. FROM with 5/5 strength. No tenderness to palpation. NVI distally.   MSK u/s right foot:  Thickening, tenosynovitis, mild neovascularity of extensor digitorum and peroneal tendons.  No other abnormalities including tibia, fibula, metatarsals, midfoot, talus, ant tib, medial ankle tendons.  Assessment & Plan:  1. Right foot pain - patient describing fairly diffuse pain.  She currently has evidence of extensor digitorum and peroneal tendinitis/tenosynovitis.  Discussed tylenol, topical medications, aleve.  Stressed importance of arch supports and discussed brands that tend to help.  Avoid flat shoes, barefoot walking.  Reviewed home exercise program.  Consider walking boot, physical therapy, custom orthotics if not improving.  F/u in 1 month.

## 2018-03-09 ENCOUNTER — Encounter: Payer: Self-pay | Admitting: Family Medicine

## 2018-03-11 ENCOUNTER — Other Ambulatory Visit: Payer: Self-pay | Admitting: *Deleted

## 2018-03-11 ENCOUNTER — Encounter: Payer: Self-pay | Admitting: *Deleted

## 2018-03-11 NOTE — Progress Notes (Signed)
Fitted Comf-Orthotic sports Replacement Insoles with a small scaphoid pad.  Please see addendum to last OV note for charges.

## 2018-03-12 ENCOUNTER — Encounter: Payer: Self-pay | Admitting: Family Medicine

## 2018-03-15 ENCOUNTER — Encounter: Payer: Self-pay | Admitting: Family Medicine

## 2018-03-16 ENCOUNTER — Encounter: Payer: Self-pay | Admitting: Family Medicine

## 2018-03-17 ENCOUNTER — Encounter: Payer: Self-pay | Admitting: Family Medicine

## 2018-04-08 ENCOUNTER — Encounter: Payer: Self-pay | Admitting: Family Medicine

## 2018-04-18 ENCOUNTER — Other Ambulatory Visit: Payer: Self-pay | Admitting: Family Medicine

## 2018-04-18 DIAGNOSIS — I10 Essential (primary) hypertension: Secondary | ICD-10-CM

## 2018-05-21 DIAGNOSIS — H04123 Dry eye syndrome of bilateral lacrimal glands: Secondary | ICD-10-CM | POA: Diagnosis not present

## 2018-05-21 DIAGNOSIS — H2513 Age-related nuclear cataract, bilateral: Secondary | ICD-10-CM | POA: Diagnosis not present

## 2018-08-05 ENCOUNTER — Encounter: Payer: Self-pay | Admitting: Family Medicine

## 2018-08-14 ENCOUNTER — Ambulatory Visit (INDEPENDENT_AMBULATORY_CARE_PROVIDER_SITE_OTHER): Payer: Medicare Other | Admitting: Family Medicine

## 2018-08-14 ENCOUNTER — Encounter: Payer: Self-pay | Admitting: Family Medicine

## 2018-08-14 VITALS — BP 152/75 | HR 75 | Ht 62.0 in | Wt 160.0 lb

## 2018-08-14 DIAGNOSIS — M79671 Pain in right foot: Secondary | ICD-10-CM | POA: Diagnosis not present

## 2018-08-14 NOTE — Progress Notes (Signed)
PCP and consultation requested by: Copland, Gay Filler, MD  Subjective:   HPI: Patient is a 72 y.o. female here for right foot pain.  4/18: Patient reports starting in January she's had pain, swelling of her foot. Worse by end of the day. Seemed to start with pain in right big toe and nail bed, moved to 2nd digit and now primarily bothering her dorsally and laterally with some soreness plantar foot. Pain level 3/10 but up to 8/10 sharp at times. Worse with ambulation. Tylenol used to help. She also tried prednisone back in march and advil. Wearing new balance shoes. No skin changes, numbness.  10/10: Patient reports she's been struggling with pain in right foot. Pain level 5-7/10 and sharp, feels in great toe with some discoloration medial aspect second digit, looks bruised. Taking naproxen every 4-5 days. Also sees bruising in medial arch near great toe. Feels like insert is rubbing her foot causing knot lateral plantar foot. Using a foot cream. Pain worse walking. Bothered to point she was getting anteromedial right knee - used biofreeze. No numbness.  Past Medical History:  Diagnosis Date  . Anxiety   . Arthritis   . Diverticulitis   . Hyperlipidemia   . Hypertension   . Other abnormal clinical finding    mirco preforation with diverticulitis  . 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 acetaminophen (TYLENOL) 325 MG tablet Take 650 mg by mouth every 6 (six) hours as needed.    . benazepril-hydrochlorthiazide (LOTENSIN HCT) 20-12.5 MG tablet Take 2 tablets by mouth daily. 180 tablet 3  . benazepril-hydrochlorthiazide (LOTENSIN HCT) 20-12.5 MG tablet TAKE 1 TABLET BY MOUTH EVERY DAY 90 tablet 1  . colchicine 0.6 MG tablet Take 2 pills once then take 1 pill an hour later 10 tablet 0  . dicyclomine (BENTYL) 20 MG tablet Take 20 mg by mouth as needed for spasms.    Marland Kitchen glucosamine-chondroitin  500-400 MG tablet Take 1 tablet by mouth daily.    . Multiple Vitamins-Minerals (MULTIVITAMIN WITH MINERALS) tablet Take 3 tablets by mouth daily.    . Omega-3 Fatty Acids (FISH OIL) 1000 MG CAPS 1 TAB DAILY    . OVER THE COUNTER MEDICATION OTC Calcium and Magnesium taking 2 tabs daily     No current facility-administered medications on file prior to visit.     Past Surgical History:  Procedure Laterality Date  . ABDOMINAL HYSTERECTOMY    . fracture left arm     with hardware  . left arm surgery     reflex sympathetic dystrophy- 9 surgeries  . NEPHRECTOMY     right    Allergies  Allergen Reactions  . Hydrocodone-Acetaminophen Nausea Only  . Motrin [Ibuprofen] Swelling  . Percocet [Oxycodone-Acetaminophen] Nausea And Vomiting  . Vicodin [Hydrocodone-Acetaminophen] Nausea Only    Social History   Socioeconomic History  . Marital status: Widowed    Spouse name: Not on file  . Number of children: Not on file  . Years of education: Not on file  . Highest education level: Not on file  Occupational History  . Not on file  Social Needs  . Financial resource strain: Not on file  . Food insecurity:    Worry: Not on file    Inability: Not on file  . Transportation needs:    Medical: Not on file    Non-medical: Not on file  Tobacco Use  .  Smoking status: Never Smoker  . Smokeless tobacco: Never Used  Substance and Sexual Activity  . Alcohol use: Yes    Alcohol/week: 4.0 standard drinks    Types: 4 Glasses of wine per week    Comment: 1 glass/day  . Drug use: No  . Sexual activity: Not on file  Lifestyle  . Physical activity:    Days per week: Not on file    Minutes per session: Not on file  . Stress: Not on file  Relationships  . Social connections:    Talks on phone: Not on file    Gets together: Not on file    Attends religious service: Not on file    Active member of club or organization: Not on file    Attends meetings of clubs or organizations: Not on file     Relationship status: Not on file  . Intimate partner violence:    Fear of current or ex partner: Not on file    Emotionally abused: Not on file    Physically abused: Not on file    Forced sexual activity: Not on file  Other Topics Concern  . Not on file  Social History Narrative  . Not on file    Family History  Problem Relation Age of Onset  . Heart disease Mother   . Diabetes Father   . Pancreatic cancer Father   . Heart disease Brother     BP (!) 152/75   Pulse 75   Ht 5\' 2"  (1.575 m)   Wt 160 lb (72.6 kg)   BMI 29.26 kg/m   Review of Systems: See HPI above.     Objective:  Physical Exam:  Gen: NAD, comfortable in exam room  Right foot/ankle: Callus mildly medial to great toenail.  Plantar wart with callus plantar distally lateral to 5th metatarsal head.  Mild hallux valgus.  Slight discoloration medial aspect 2nd digit.  No other gross deformity, swelling, ecchymoses FROM with 5/5 strength, no pain. TTP over plantar wart, medial to great toenail.  No other tenderness. Negative ant drawer and talar tilt.   Negative syndesmotic compression. Thompsons test negative. NV intact distally.  Assessment & Plan:  1. Right foot pain - current pain more related to crowding of forefoot in shoes.  She will try her current shoes without sports insoles as she's getting pain plantar wart laterally.  We also discussed OTC treatments for this vs podiatry or derm referrals.  Consider fit eval at [facility].  Naproxen as needed.

## 2018-08-14 NOTE — Patient Instructions (Signed)
Try the shoes without the green insoles. Consider evaluation at [facility] - you're getting friction in your forefoot leading to pain especially of this great toe and forcing the 1st and 2nd toes together. Let me know if you want [place] to refer you to podiatry or dermatology (I'd probably go podiatry) to help pare down the callus/wart on this right foot. Keep me updated otherwise via email or call me.

## 2018-08-17 ENCOUNTER — Encounter: Payer: Self-pay | Admitting: Family Medicine

## 2018-08-18 ENCOUNTER — Encounter: Payer: Self-pay | Admitting: Family Medicine

## 2018-08-19 NOTE — Addendum Note (Signed)
Addended by: Sherrie George F on: 08/19/2018 09:33 AM   Modules accepted: Orders

## 2018-08-25 DIAGNOSIS — L84 Corns and callosities: Secondary | ICD-10-CM | POA: Diagnosis not present

## 2018-08-25 DIAGNOSIS — M7741 Metatarsalgia, right foot: Secondary | ICD-10-CM | POA: Diagnosis not present

## 2018-09-04 ENCOUNTER — Encounter: Payer: Self-pay | Admitting: Family Medicine

## 2018-09-30 ENCOUNTER — Encounter: Payer: Self-pay | Admitting: Family Medicine

## 2018-10-07 ENCOUNTER — Ambulatory Visit (INDEPENDENT_AMBULATORY_CARE_PROVIDER_SITE_OTHER): Payer: Medicare Other | Admitting: Family Medicine

## 2018-10-07 ENCOUNTER — Encounter: Payer: Self-pay | Admitting: Family Medicine

## 2018-10-07 VITALS — BP 127/69 | HR 63 | Ht 62.0 in | Wt 162.0 lb

## 2018-10-07 DIAGNOSIS — M79671 Pain in right foot: Secondary | ICD-10-CM | POA: Diagnosis not present

## 2018-10-07 NOTE — Progress Notes (Signed)
PCP and consultation requested by: Copland, Gay Filler, MD  Subjective:   HPI: Patient is a 72 y.o. female here for right foot pain.  4/18: Patient reports starting in January she's had pain, swelling of her foot. Worse by end of the day. Seemed to start with pain in right big toe and nail bed, moved to 2nd digit and now primarily bothering her dorsally and laterally with some soreness plantar foot. Pain level 3/10 but up to 8/10 sharp at times. Worse with ambulation. Tylenol used to help. She also tried prednisone back in march and advil. Wearing new balance shoes. No skin changes, numbness.  10/10: Patient reports she's been struggling with pain in right foot. Pain level 5-7/10 and sharp, feels in great toe with some discoloration medial aspect second digit, looks bruised. Taking naproxen every 4-5 days. Also sees bruising in medial arch near great toe. Feels like insert is rubbing her foot causing knot lateral plantar foot. Using a foot cream. Pain worse walking. Bothered to point she was getting anteromedial right knee - used biofreeze. No numbness.  12/3: Patient reports she's continued to struggle with pain in right foot. Pain is primarily about 2nd digit dorsally into 2nd digit. She can barely touch this digit at times. Associated swelling, numbness, discoloration. Pain on bottom of foot has improved. No pain left foot. Pain is 1/10 currently but up to 8/10 and sharp at worst. No skin changes.  Past Medical History:  Diagnosis Date  . Anxiety   . Arthritis   . Diverticulitis   . Hyperlipidemia   . Hypertension   . Other abnormal clinical finding    mirco preforation with diverticulitis  . 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 acetaminophen (TYLENOL) 325 MG tablet Take 650 mg by mouth every 6 (six) hours as needed.    . benazepril-hydrochlorthiazide (LOTENSIN HCT) 20-12.5 MG  tablet TAKE 1 TABLET BY MOUTH EVERY DAY 90 tablet 1  . colchicine 0.6 MG tablet Take 2 pills once then take 1 pill an hour later 10 tablet 0  . dicyclomine (BENTYL) 20 MG tablet Take 20 mg by mouth as needed for spasms.    Marland Kitchen glucosamine-chondroitin 500-400 MG tablet Take 1 tablet by mouth daily.    . Multiple Vitamin (MULTI-VITAMINS) TABS Take by mouth.    . Multiple Vitamins-Minerals (MULTIVITAMIN WITH MINERALS) tablet Take 3 tablets by mouth daily.    . Omega-3 Fatty Acids (FISH OIL) 1000 MG CAPS 1 TAB DAILY    . OVER THE COUNTER MEDICATION OTC Calcium and Magnesium taking 2 tabs daily     No current facility-administered medications on file prior to visit.     Past Surgical History:  Procedure Laterality Date  . ABDOMINAL HYSTERECTOMY    . fracture left arm     with hardware  . left arm surgery     reflex sympathetic dystrophy- 9 surgeries  . NEPHRECTOMY     right    Allergies  Allergen Reactions  . Hydrocodone-Acetaminophen Nausea Only  . Influenza Vaccines     Pt had localized redness following high dose flu vaccine   . Motrin [Ibuprofen] Swelling  . Percocet [Oxycodone-Acetaminophen] Nausea And Vomiting  . Vicodin [Hydrocodone-Acetaminophen] Nausea Only    Social History   Socioeconomic History  . Marital status: Widowed    Spouse name: Not on file  . Number of children: Not on file  .  Years of education: Not on file  . Highest education level: Not on file  Occupational History  . Not on file  Social Needs  . Financial resource strain: Not on file  . Food insecurity:    Worry: Not on file    Inability: Not on file  . Transportation needs:    Medical: Not on file    Non-medical: Not on file  Tobacco Use  . Smoking status: Never Smoker  . Smokeless tobacco: Never Used  Substance and Sexual Activity  . Alcohol use: Yes    Alcohol/week: 4.0 standard drinks    Types: 4 Glasses of wine per week    Comment: 1 glass/day  . Drug use: No  . Sexual activity: Not  on file  Lifestyle  . Physical activity:    Days per week: Not on file    Minutes per session: Not on file  . Stress: Not on file  Relationships  . Social connections:    Talks on phone: Not on file    Gets together: Not on file    Attends religious service: Not on file    Active member of club or organization: Not on file    Attends meetings of clubs or organizations: Not on file    Relationship status: Not on file  . Intimate partner violence:    Fear of current or ex partner: Not on file    Emotionally abused: Not on file    Physically abused: Not on file    Forced sexual activity: Not on file  Other Topics Concern  . Not on file  Social History Narrative  . Not on file    Family History  Problem Relation Age of Onset  . Heart disease Mother   . Diabetes Father   . Pancreatic cancer Father   . Heart disease Brother     BP 127/69   Pulse 63   Ht 5\' 2"  (1.575 m)   Wt 162 lb (73.5 kg)   BMI 29.63 kg/m   Review of Systems: See HPI above.     Objective:  Physical Exam:  Gen: NAD, comfortable in exam room  Right foot/ankle: No gross deformity, swelling, ecchymoses.   Callus no longer present, plantar wart no longer present.  No malrotation or angulation of digits.  Mild hallux valgus. FROM with 5/5 strength all directions without pain. TTP dorsal 2nd digit and distal 2nd metatarsal.  No other tenderness. Negative metatarsal squeeze. Negative ant drawer and talar tilt.   Negative syndesmotic compression. Thompsons test negative. NV intact distally.  Assessment & Plan:  1. Right foot pain - continues to have fairly severe pain centralized to 2nd metatarsal and digit with some neuropathic features.  Brief MSK u/s of the area today is reassuring.  Advised given continued pain despite conservative measures (inserts, naproxen, prednisone) we go ahead with MRI to further assess for possible stress fracture, other abnormalities.  Discussed if not improving would  consider neuro eval, checking B12.

## 2018-10-07 NOTE — Patient Instructions (Signed)
We will go ahead with an MRI of your foot as the next step. I will call you with results and next steps.

## 2018-10-08 ENCOUNTER — Encounter: Payer: Self-pay | Admitting: Family Medicine

## 2018-10-10 ENCOUNTER — Encounter: Payer: Self-pay | Admitting: Family Medicine

## 2018-11-10 ENCOUNTER — Ambulatory Visit
Admission: RE | Admit: 2018-11-10 | Discharge: 2018-11-10 | Disposition: A | Discharge status: Home / Self Care | Payer: Federal, State, Local not specified - PPO | Source: Ambulatory Visit | Attending: Family Medicine | Admitting: Family Medicine

## 2018-11-10 DIAGNOSIS — R600 Localized edema: Secondary | ICD-10-CM | POA: Diagnosis not present

## 2018-11-10 DIAGNOSIS — M79671 Pain in right foot: Secondary | ICD-10-CM

## 2018-11-14 ENCOUNTER — Other Ambulatory Visit: Payer: Self-pay | Admitting: Family Medicine

## 2018-11-14 DIAGNOSIS — R200 Anesthesia of skin: Secondary | ICD-10-CM

## 2018-11-14 DIAGNOSIS — M79671 Pain in right foot: Secondary | ICD-10-CM

## 2018-11-14 NOTE — Progress Notes (Signed)
MRI reviewed and discussed with patient.  Some low level edema in 1st metatarsal but this does not correspond to her area of pain.  We discussed options - advised we go ahead with checking B12, depending on results may refer to neurology.

## 2018-11-19 ENCOUNTER — Other Ambulatory Visit: Payer: Self-pay | Admitting: Family Medicine

## 2018-11-19 DIAGNOSIS — R200 Anesthesia of skin: Secondary | ICD-10-CM | POA: Diagnosis not present

## 2018-11-19 DIAGNOSIS — M79671 Pain in right foot: Secondary | ICD-10-CM | POA: Diagnosis not present

## 2018-11-20 LAB — VITAMIN B12: VITAMIN B 12: 852 pg/mL (ref 232–1245)

## 2018-11-28 NOTE — Addendum Note (Signed)
Addended by: Sherrie George F on: 11/28/2018 08:03 AM   Modules accepted: Orders

## 2018-12-01 ENCOUNTER — Encounter: Payer: Self-pay | Admitting: Neurology

## 2018-12-01 ENCOUNTER — Ambulatory Visit (INDEPENDENT_AMBULATORY_CARE_PROVIDER_SITE_OTHER): Payer: Medicare Other | Admitting: Neurology

## 2018-12-01 VITALS — BP 155/72 | HR 70 | Ht 62.0 in | Wt 166.0 lb

## 2018-12-01 DIAGNOSIS — M79671 Pain in right foot: Secondary | ICD-10-CM | POA: Diagnosis not present

## 2018-12-01 MED ORDER — NABUMETONE 500 MG PO TABS: 500.0000 mg | ORAL_TABLET | Freq: Every day | ORAL | 5 refills | Status: DC

## 2018-12-01 NOTE — Progress Notes (Signed)
[facility]  PATIENT: Lindsey Shepard DOB: 04/24/1946  REFERRING DOCTOR OR PCP: Karlton Lemon, MD SOURCE: Patient, notes from Dr. Barbaraann Barthel, imaging reports.  _________________________________   HISTORICAL  CHIEF COMPLAINT:  Chief Complaint  Patient presents with  . New Patient (Initial Visit)    RM 12, alone. Internal referral from Karlton Lemon, MD w/ [facility] for right foot pain. Right foot pain started about a year ago. Week before thanksgiving she was getting new well for her home.     HISTORY OF PRESENT ILLNESS:  I had the pleasure seeing patient, Lindsey Shepard, at [facility] for neurologic consultation regarding her right foot pain.  She is a 73 y.o. woman who began to experience pain in her right foot 10/2017 that intensified 11/2017.   Pain is mostly in the great toe and second toe.  It worsens with walking.  As pain intensifies, she notes other toes also involved and notes more numbness.  If she walks further pain increases first to ankle then knee and then hip.  She usually rests before pain involves more of the leg.   She notes much milder left sided pain -- only in the left great toe at times.   She notes swelling in her foot.    She denies any weakness on the right.   If she takes Naproxen, pain improves.   She notes mild stomach upset when she takes it but her benefit can be > 1 day.  Also, she notes hypertension is sometimes worse with the Naproxen.    If she takes Tylenol daily, she gets an upset stomach.  Pain does not improve with sleep, elevation or using a shoe insole.  She saw Dr. Barbaraann Barthel.   She had an ultrasound and tendinitis was noted initially.   DVT has been ruled out.   Foot Xray and MRI were performed.   MRI showed minimal marrow edema in the distal shaft of the first metatarsal.  The insole caused a callus.   She also saw Dr. Joya Gaskins (Podiatry) and had a callus removed,  She notes she broke her  ankle in 1997.  Since then the 3rd, 4th and 5th toes on the left have been mildly numb but there has not been weakness.     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  Her creatinine was normal at 0.75 01/15/2018.     REVIEW OF SYSTEMS: Constitutional: No fevers, chills, sweats, or change in appetite Eyes: No visual changes, double vision, eye pain Ear, nose and throat: No hearing loss, ear pain, nasal congestion, sore throat Cardiovascular: No chest pain, palpitations Respiratory: No shortness of breath at rest or with exertion.   No wheezes GastrointestinaI: No nausea, vomiting, diarrhea, abdominal pain, fecal incontinence Genitourinary: No dysuria, urinary retention or frequency.  No nocturia. Musculoskeletal: No neck pain, back pain Integumentary: No rash, pruritus, skin lesions Neurological: as above Psychiatric: No depression at this time.  No anxiety Endocrine: No palpitations, diaphoresis, change in appetite, change in weigh or increased thirst Hematologic/Lymphatic: No anemia, purpura, petechiae. Allergic/Immunologic: No itchy/runny eyes, nasal congestion, recent allergic reactions, rashes  ALLERGIES: Allergies  Allergen Reactions  . Hydrocodone-Acetaminophen Nausea Only  . Influenza Vaccines     Pt had localized redness following high dose flu vaccine   . Motrin [Ibuprofen] Swelling  . Percocet [Oxycodone-Acetaminophen] Nausea And Vomiting  . Vicodin [Hydrocodone-Acetaminophen] Nausea Only    HOME MEDICATIONS:  Current Outpatient Medications:  .  acetaminophen (TYLENOL) 325 MG tablet, Take 650 mg by mouth every 6 (six) hours as needed., Disp: , Rfl:  .  benazepril-hydrochlorthiazide (LOTENSIN HCT) 20-12.5 MG tablet, TAKE 1 TABLET BY MOUTH EVERY DAY (Patient taking differently: Take 2 tablets by mouth daily. ), Disp: 90 tablet, Rfl: 1 .  glucosamine-chondroitin 500-400 MG tablet, Take 1 tablet by mouth daily., Disp: , Rfl:  .  Multiple Vitamin  (MULTI-VITAMINS) TABS, Take by mouth., Disp: , Rfl:  .  Omega-3 Fatty Acids (FISH OIL) 1000 MG CAPS, 1 TAB DAILY, Disp: , Rfl:  .  OVER THE COUNTER MEDICATION, OTC Calcium and Magnesium taking 2 tabs daily, Disp: , Rfl:  .  nabumetone (RELAFEN) 500 MG tablet, Take 1 tablet (500 mg total) by mouth daily., Disp: 30 tablet, Rfl: 5  PAST MEDICAL HISTORY: Past Medical History:  Diagnosis Date  . Anxiety   . Arthritis   . Diverticulitis   . Hyperlipidemia   . Hypertension   . Other abnormal clinical finding    mirco preforation with diverticulitis  . 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 ABDOMINAL HYSTERECTOMY    . fracture left arm     with hardware  . left arm surgery     reflex sympathetic dystrophy- 9 surgeries  . NEPHRECTOMY     right    FAMILY HISTORY: Family History  Problem Relation Age of Onset  . Heart disease Mother   . Diabetes Father   . Pancreatic cancer Father   . Heart disease Brother     SOCIAL HISTORY:  Social History   Socioeconomic History  . Marital status: Widowed    Spouse name: Not on file  . Number of children: 1  . Years of education: BS  . Highest education level: Not on file  Occupational History  . Occupation: Retired  Scientific laboratory technician  . Financial resource strain: Not on file  . Food insecurity:    Worry: Not on file    Inability: Not on file  . Transportation needs:    Medical: Not on file    Non-medical: Not on file  Tobacco Use  . Smoking status: Never Smoker  . Smokeless tobacco: Never Used  Substance and Sexual Activity  . Alcohol use: Yes    Alcohol/week: 4.0 standard drinks    Types: 4 Glasses of wine per week    Comment: 1 glass/day  . Drug use: No  . Sexual activity: Not on file  Lifestyle  . Physical activity:    Days per week: Not on file    Minutes per session: Not on file  . Stress: Not on file  Relationships  . Social connections:    Talks on  phone: Not on file    Gets together: Not on file    Attends religious service: Not on file    Active member of club or organization: Not on file    Attends meetings of clubs or organizations: Not on file    Relationship status: Not on file  . Intimate partner violence:    Fear of current or ex partner: Not on file    Emotionally abused: Not on file    Physically abused: Not on file    Forced sexual activity: Not on file  Other Topics Concern  . Not on file  Social History Narrative   Right handed    Coffee every morning   Lives alone  PHYSICAL EXAM  Vitals:   12/01/18 1249  BP: (!) 155/72  Pulse: 70  Weight: 166 lb (75.3 kg)  Height: 5\' 2"  (1.575 m)    Body mass index is 30.36 kg/m.   General: The patient is well-developed and well-nourished and in no acute distress.   Carotid artery pulses are normal and there are no bruits.  The heart has a regular rate and rhythm no murmurs gallops or rubs.   Skin: Extremities are without rash or edema.  Musculoskeletal:  Back is nontender  Neurologic Exam  Mental status: The patient is alert and oriented x 3 at the time of the examination. The patient has apparent normal recent and remote memory, with an apparently normal attention span and concentration ability.   Speech is normal.  Cranial nerves: Extraocular movements are full.  Facial strength is normal.  No dysarthria no obvious hearing deficits are noted.  Motor:  Muscle bulk is normal.   Tone is normal. Strength is  5 / 5 in all 4 extremities.   Sensory: Sensory testing is intact to pinprick, soft touch and vibration sensation in both feet  Coordination: Cerebellar testing reveals good finger-nose-finger and heel-to-shin bilaterally.  Gait and station: Station is normal.   Gait is normal. Tandem gait is normal. Romberg is negative.   Reflexes: Deep tendon reflexes are symmetric and normal bilaterally.   Plantar responses are flexor.    DIAGNOSTIC DATA (LABS,  IMAGING, TESTING) - I reviewed patient records, labs, notes, testing and imaging myself where available.  Lab Results  Component Value Date   WBC 7.9 12/05/2017   HGB 13.3 12/05/2017   HCT 40.1 12/05/2017   MCV 93.1 12/05/2017   PLT 424.0 (H) 12/05/2017      Component Value Date/Time   NA 142 01/15/2018 0919   K 5.0 01/15/2018 0919   CL 102 01/15/2018 0919   CO2 30 01/15/2018 0919   GLUCOSE 116 (H) 01/15/2018 0919   BUN 13 01/15/2018 0919   CREATININE 0.75 01/15/2018 0919   CREATININE 0.81 03/14/2015 0916   CALCIUM 10.5 01/15/2018 0919   PROT 6.9 12/05/2017 1529   ALBUMIN 4.2 12/05/2017 1529   AST 22 12/05/2017 1529   ALT 24 12/05/2017 1529   ALKPHOS 64 12/05/2017 1529   BILITOT 0.3 12/05/2017 1529   Lab Results  Component Value Date   CHOL 228 (H) 05/01/2017   HDL 51.10 05/01/2017   LDLCALC 158 (H) 05/01/2017   TRIG 93.0 05/01/2017   CHOLHDL 4 05/01/2017   Lab Results  Component Value Date   HGBA1C 6.2 12/05/2017   Lab Results  Component Value Date   PPJKDTOI71 245 11/19/2018   Lab Results  Component Value Date   TSH 1.666 03/15/2014       ASSESSMENT AND PLAN  Right foot pain   In summary, Ms. Gebel is a 73 year old woman with right foot pain for over a year.  The etiology is uncertain.  The MRI did show some marrow edema at the distal first metatarsal though I am uncertain if this is the source.  She does not have any evidence of a mononeuropathy or polyneuropathy.  She has responded to NSAIDs but has had some tolerability issues.  I will have her try nabumetone 500 mg daily to see if that is beneficial without side effects.  She should remain active but avoid strenuous activity if it worsens her pain.  She will return to see me as needed if new or worsening neurologic symptoms.  Thank  you for asking me to see Ms. Hollywood.  Please let me know if I can be of further assistance with her or other patients in the future.    Vella Colquitt A. Felecia Shelling, MD, [facility]  7/74/1423, 9:53 PM Certified in Neurology, Clinical Neurophysiology, Sleep Medicine, Pain Medicine and Neuroimaging  [facility] [address], [place], [place] [address] [phone]

## 2018-12-05 ENCOUNTER — Telehealth: Payer: Self-pay | Admitting: Neurology

## 2018-12-05 NOTE — Telephone Encounter (Signed)
Dr. Felecia Shelling- please advise. Also sent you a mychart message about this.

## 2018-12-05 NOTE — Telephone Encounter (Signed)
Pt called stating that she has taken the Nabumetone 500mg  for 3 days. First day 1/2 tablet because she did not receive prescription until afternoon. First full dose was on Wed and then again Thurs. She states that she had red cheeks this morning, swollen eyes, rash on inside of arms and back appears solid rash. The Nabumetone 500mg  has helped some but does not take all the pain away but at times the pressure on the toes is tolerable other times more severe. Does not have trouble swallowing or breathing.  Will not take more until pt hears from you. Her pharmacist pointed out that she has a history of allergies to such medication, those listed on her chart.  Pt would like to know if it is ok to take Benadryl. Please advise.

## 2018-12-05 NOTE — Telephone Encounter (Signed)
I replied to her mychart message.

## 2019-02-25 ENCOUNTER — Other Ambulatory Visit: Payer: Self-pay | Admitting: Family Medicine

## 2019-02-25 DIAGNOSIS — I10 Essential (primary) hypertension: Secondary | ICD-10-CM

## 2019-04-06 ENCOUNTER — Encounter: Payer: Self-pay | Admitting: Family Medicine

## 2019-05-27 DIAGNOSIS — H5203 Hypermetropia, bilateral: Secondary | ICD-10-CM | POA: Diagnosis not present

## 2019-05-27 DIAGNOSIS — H524 Presbyopia: Secondary | ICD-10-CM | POA: Diagnosis not present

## 2019-05-27 DIAGNOSIS — H52203 Unspecified astigmatism, bilateral: Secondary | ICD-10-CM | POA: Diagnosis not present

## 2019-05-27 DIAGNOSIS — H2513 Age-related nuclear cataract, bilateral: Secondary | ICD-10-CM | POA: Diagnosis not present

## 2019-07-24 ENCOUNTER — Encounter: Payer: Self-pay | Admitting: Family Medicine

## 2019-08-09 NOTE — Patient Instructions (Addendum)
It was great to see you again today, I will be in touch with your labs ASAP Please consider getting the shingles vaccine at your drugstore-[facility] Let me know if you have a reaction to your flu shot this year - we will give you the regular dose  I am so glad that your daughter is doing well!!    Your BP is under good control - refilled your medication  Let me know if you do want to see one of the foot and ankle orthopedists in town around your ankle  Ok to continue your naproxen for now as needed    Health Maintenance After Age 61 After age 63, you are at a higher risk for certain long-term diseases and infections as well as injuries from falls. Falls are a major cause of broken bones and head injuries in people who are older than age 46. Getting regular preventive care can help to keep you healthy and well. Preventive care includes getting regular testing and making lifestyle changes as recommended by your health care provider. Talk with your health care provider about:  Which screenings and tests you should have. A screening is a test that checks for a disease when you have no symptoms.  A diet and exercise plan that is right for you. What should I know about screenings and tests to prevent falls? Screening and testing are the best ways to find a health problem early. Early diagnosis and treatment give you the best chance of managing medical conditions that are common after age 61. Certain conditions and lifestyle choices may make you more likely to have a fall. Your health care provider may recommend:  Regular vision checks. Poor vision and conditions such as cataracts can make you more likely to have a fall. If you wear glasses, make sure to get your prescription updated if your vision changes.  Medicine review. Work with your health care provider to regularly review all of the medicines you are taking, including over-the-counter medicines. Ask your health care provider about any side  effects that may make you more likely to have a fall. Tell your health care provider if any medicines that you take make you feel dizzy or sleepy.  Osteoporosis screening. Osteoporosis is a condition that causes the bones to get weaker. This can make the bones weak and cause them to break more easily.  Blood pressure screening. Blood pressure changes and medicines to control blood pressure can make you feel dizzy.  Strength and balance checks. Your health care provider may recommend certain tests to check your strength and balance while standing, walking, or changing positions.  Foot health exam. Foot pain and numbness, as well as not wearing proper footwear, can make you more likely to have a fall.  Depression screening. You may be more likely to have a fall if you have a fear of falling, feel emotionally low, or feel unable to do activities that you used to do.  Alcohol use screening. Using too much alcohol can affect your balance and may make you more likely to have a fall. What actions can I take to lower my risk of falls? General instructions  Talk with your health care provider about your risks for falling. Tell your health care provider if: ? You fall. Be sure to tell your health care provider about all falls, even ones that seem minor. ? You feel dizzy, sleepy, or off-balance.  Take over-the-counter and prescription medicines only as told by your health care provider. These  include any supplements.  Eat a healthy diet and maintain a healthy weight. A healthy diet includes low-fat dairy products, low-fat (lean) meats, and fiber from whole grains, beans, and lots of fruits and vegetables. Home safety  Remove any tripping hazards, such as rugs, cords, and clutter.  Install safety equipment such as grab bars in bathrooms and safety rails on stairs.  Keep rooms and walkways well-lit. Activity   Follow a regular exercise program to stay fit. This will help you maintain your  balance. Ask your health care provider what types of exercise are appropriate for you.  If you need a cane or walker, use it as recommended by your health care provider.  Wear supportive shoes that have nonskid soles. Lifestyle  Do not drink alcohol if your health care provider tells you not to drink.  If you drink alcohol, limit how much you have: ? 0-1 drink a day for women. ? 0-2 drinks a day for men.  Be aware of how much alcohol is in your drink. In the [place] one drink equals one typical bottle of beer (12 oz), one-half glass of wine (5 oz), or one shot of hard liquor (1 oz).  Do not use any products that contain nicotine or tobacco, such as cigarettes and e-cigarettes. If you need help quitting, ask your health care provider. Summary  Having a healthy lifestyle and getting preventive care can help to protect your health and wellness after age 62.  Screening and testing are the best way to find a health problem early and help you avoid having a fall. Early diagnosis and treatment give you the best chance for managing medical conditions that are more common for people who are older than age 68.  Falls are a major cause of broken bones and head injuries in people who are older than age 92. Take precautions to prevent a fall at home.  Work with your health care provider to learn what changes you can make to improve your health and wellness and to prevent falls. This information is not intended to replace advice given to you by your health care provider. Make sure you discuss any questions you have with your health care provider. Document Released: 09/04/2017 Document Revised: 02/12/2019 Document Reviewed: 09/04/2017 Elsevier Patient Education  2020 [employer].
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
## 2019-08-17 ENCOUNTER — Other Ambulatory Visit: Payer: Self-pay

## 2019-08-17 ENCOUNTER — Ambulatory Visit (INDEPENDENT_AMBULATORY_CARE_PROVIDER_SITE_OTHER): Payer: Medicare Other | Admitting: Family Medicine

## 2019-08-17 ENCOUNTER — Encounter: Payer: Self-pay | Admitting: Family Medicine

## 2019-08-17 VITALS — BP 122/70 | HR 67 | Temp 97.2°F | Resp 16 | Ht 62.0 in | Wt 165.0 lb

## 2019-08-17 DIAGNOSIS — Z1231 Encounter for screening mammogram for malignant neoplasm of breast: Secondary | ICD-10-CM | POA: Diagnosis not present

## 2019-08-17 DIAGNOSIS — E2839 Other primary ovarian failure: Secondary | ICD-10-CM

## 2019-08-17 DIAGNOSIS — R7303 Prediabetes: Secondary | ICD-10-CM

## 2019-08-17 DIAGNOSIS — M79671 Pain in right foot: Secondary | ICD-10-CM

## 2019-08-17 DIAGNOSIS — M8580 Other specified disorders of bone density and structure, unspecified site: Secondary | ICD-10-CM

## 2019-08-17 DIAGNOSIS — Z23 Encounter for immunization: Secondary | ICD-10-CM | POA: Diagnosis not present

## 2019-08-17 DIAGNOSIS — E782 Mixed hyperlipidemia: Secondary | ICD-10-CM

## 2019-08-17 DIAGNOSIS — I10 Essential (primary) hypertension: Secondary | ICD-10-CM | POA: Diagnosis not present

## 2019-08-17 LAB — COMPREHENSIVE METABOLIC PANEL
ALT: 18 U/L (ref 0–35)
AST: 19 U/L (ref 0–37)
Albumin: 4.5 g/dL (ref 3.5–5.2)
Alkaline Phosphatase: 63 U/L (ref 39–117)
BUN: 17 mg/dL (ref 6–23)
CO2: 33 mEq/L — ABNORMAL HIGH (ref 19–32)
Calcium: 10.2 mg/dL (ref 8.4–10.5)
Chloride: 98 mEq/L (ref 96–112)
Creatinine, Ser: 0.81 mg/dL (ref 0.40–1.20)
GFR: 69.12 mL/min (ref >60.00)
Glucose, Bld: 112 mg/dL — ABNORMAL HIGH (ref 70–99)
Potassium: 5.2 mEq/L — ABNORMAL HIGH (ref 3.5–5.1)
Sodium: 139 mEq/L (ref 135–145)
Total Bilirubin: 0.5 mg/dL (ref 0.2–1.2)
Total Protein: 6.7 g/dL (ref 6.0–8.3)

## 2019-08-17 LAB — LIPID PANEL
Cholesterol: 257 mg/dL — ABNORMAL HIGH (ref 0–200)
HDL: 53.7 mg/dL (ref >39.00)
LDL Cholesterol: 183 mg/dL — ABNORMAL HIGH (ref 0–99)
NonHDL: 203.52
Total CHOL/HDL Ratio: 5
Triglycerides: 101 mg/dL (ref 0.0–149.0)
VLDL: 20.2 mg/dL (ref 0.0–40.0)

## 2019-08-17 LAB — CBC
HCT: 41.3 % (ref 36.0–46.0)
Hemoglobin: 13.9 g/dL (ref 12.0–15.0)
MCHC: 33.7 g/dL (ref 30.0–36.0)
MCV: 93 fl (ref 78.0–100.0)
Platelets: 491 10*3/uL — ABNORMAL HIGH (ref 150.0–400.0)
RBC: 4.44 Mil/uL (ref 3.87–5.11)
RDW: 14.9 % (ref 11.5–15.5)
WBC: 7.1 10*3/uL (ref 4.0–10.5)

## 2019-08-17 LAB — HEMOGLOBIN A1C: Hgb A1c MFr Bld: 6.1 % (ref 4.6–6.5)

## 2019-08-17 MED ORDER — BENAZEPRIL-HYDROCHLOROTHIAZIDE 20-12.5 MG PO TABS: 2.0000 | ORAL_TABLET | Freq: Every day | ORAL | 3 refills | Status: DC

## 2019-08-18 ENCOUNTER — Encounter: Payer: Self-pay | Admitting: Family Medicine

## 2019-08-18 DIAGNOSIS — R7303 Prediabetes: Secondary | ICD-10-CM | POA: Insufficient documentation

## 2019-08-20 ENCOUNTER — Ambulatory Visit: Payer: Medicare Other | Admitting: Family Medicine

## 2019-08-20 ENCOUNTER — Encounter: Payer: Self-pay | Admitting: Family Medicine

## 2019-12-02 ENCOUNTER — Encounter: Payer: Self-pay | Admitting: Family Medicine

## 2019-12-04 DIAGNOSIS — Z20828 Contact with and (suspected) exposure to other viral communicable diseases: Secondary | ICD-10-CM | POA: Diagnosis not present

## 2020-01-06 DIAGNOSIS — Z23 Encounter for immunization: Secondary | ICD-10-CM | POA: Diagnosis not present

## 2020-01-20 ENCOUNTER — Encounter: Payer: Self-pay | Admitting: Family Medicine

## 2020-02-04 DIAGNOSIS — Z23 Encounter for immunization: Secondary | ICD-10-CM | POA: Diagnosis not present

## 2020-02-05 ENCOUNTER — Encounter: Payer: Self-pay | Admitting: Family Medicine

## 2020-06-01 DIAGNOSIS — H2513 Age-related nuclear cataract, bilateral: Secondary | ICD-10-CM | POA: Diagnosis not present

## 2020-06-29 ENCOUNTER — Encounter: Payer: Self-pay | Admitting: Family Medicine

## 2020-08-10 ENCOUNTER — Other Ambulatory Visit: Payer: Self-pay | Admitting: Family Medicine

## 2020-08-10 DIAGNOSIS — I10 Essential (primary) hypertension: Secondary | ICD-10-CM

## 2020-09-10 ENCOUNTER — Other Ambulatory Visit: Payer: Self-pay | Admitting: Family Medicine

## 2020-09-10 DIAGNOSIS — I10 Essential (primary) hypertension: Secondary | ICD-10-CM
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
## 2020-09-13 NOTE — Patient Instructions (Addendum)
Good to see you again today- I will be in touch with your labs asap  Your BP looks fine [facility] is my dentist   We will set you up for a mammogram and a bone density at your convenience  I would recommend a stool softener such as docusate sodium for any hard stools Tucks pads and Preparation H can be helpful for minor hemorrhoid sx

## 2020-09-15 ENCOUNTER — Encounter: Payer: Self-pay | Admitting: Family Medicine

## 2020-09-15 ENCOUNTER — Ambulatory Visit (INDEPENDENT_AMBULATORY_CARE_PROVIDER_SITE_OTHER): Payer: Medicare Other | Admitting: Family Medicine

## 2020-09-15 ENCOUNTER — Other Ambulatory Visit: Payer: Self-pay

## 2020-09-15 VITALS — BP 132/80 | HR 96 | Resp 16 | Ht 62.0 in | Wt 167.0 lb

## 2020-09-15 DIAGNOSIS — E7800 Pure hypercholesterolemia, unspecified: Secondary | ICD-10-CM

## 2020-09-15 DIAGNOSIS — E2839 Other primary ovarian failure: Secondary | ICD-10-CM | POA: Diagnosis not present

## 2020-09-15 DIAGNOSIS — E782 Mixed hyperlipidemia: Secondary | ICD-10-CM | POA: Diagnosis not present

## 2020-09-15 DIAGNOSIS — I10 Essential (primary) hypertension: Secondary | ICD-10-CM | POA: Diagnosis not present

## 2020-09-15 DIAGNOSIS — Z1231 Encounter for screening mammogram for malignant neoplasm of breast: Secondary | ICD-10-CM | POA: Diagnosis not present

## 2020-09-15 DIAGNOSIS — R7303 Prediabetes: Secondary | ICD-10-CM | POA: Diagnosis not present

## 2020-09-15 LAB — COMPREHENSIVE METABOLIC PANEL
ALT: 22 U/L (ref 0–35)
AST: 20 U/L (ref 0–37)
Albumin: 4.6 g/dL (ref 3.5–5.2)
Alkaline Phosphatase: 67 U/L (ref 39–117)
BUN: 20 mg/dL (ref 6–23)
CO2: 33 mEq/L — ABNORMAL HIGH (ref 19–32)
Calcium: 9.8 mg/dL (ref 8.4–10.5)
Chloride: 98 mEq/L (ref 96–112)
Creatinine, Ser: 0.8 mg/dL (ref 0.40–1.20)
GFR: 72.25 mL/min (ref >60.00)
Glucose, Bld: 96 mg/dL (ref 70–99)
Potassium: 4.9 mEq/L (ref 3.5–5.1)
Sodium: 138 mEq/L (ref 135–145)
Total Bilirubin: 0.6 mg/dL (ref 0.2–1.2)
Total Protein: 6.6 g/dL (ref 6.0–8.3)

## 2020-09-15 LAB — CBC
HCT: 41.5 % (ref 36.0–46.0)
Hemoglobin: 13.8 g/dL (ref 12.0–15.0)
MCHC: 33.2 g/dL (ref 30.0–36.0)
MCV: 92.1 fl (ref 78.0–100.0)
Platelets: 517 10*3/uL — ABNORMAL HIGH (ref 150.0–400.0)
RBC: 4.51 Mil/uL (ref 3.87–5.11)
RDW: 14.8 % (ref 11.5–15.5)
WBC: 7.1 10*3/uL (ref 4.0–10.5)

## 2020-09-15 LAB — LIPID PANEL
Cholesterol: 262 mg/dL — ABNORMAL HIGH (ref 0–200)
HDL: 57.3 mg/dL (ref >39.00)
LDL Cholesterol: 184 mg/dL — ABNORMAL HIGH (ref 0–99)
NonHDL: 204.59
Total CHOL/HDL Ratio: 5
Triglycerides: 101 mg/dL (ref 0.0–149.0)
VLDL: 20.2 mg/dL (ref 0.0–40.0)

## 2020-09-15 LAB — HEMOGLOBIN A1C: Hgb A1c MFr Bld: 6.2 % (ref 4.6–6.5)

## 2020-09-15 MED ORDER — BENAZEPRIL-HYDROCHLOROTHIAZIDE 20-12.5 MG PO TABS: 2.0000 | ORAL_TABLET | Freq: Every day | ORAL | 3 refills | Status: DC

## 2021-01-05 ENCOUNTER — Ambulatory Visit (HOSPITAL_BASED_OUTPATIENT_CLINIC_OR_DEPARTMENT_OTHER)
Admission: RE | Admit: 2021-01-05 | Discharge: 2021-01-05 | Disposition: A | Discharge status: Home / Self Care | Payer: Medicare Other | Source: Ambulatory Visit | Attending: Family Medicine | Admitting: Family Medicine

## 2021-01-05 ENCOUNTER — Encounter (HOSPITAL_BASED_OUTPATIENT_CLINIC_OR_DEPARTMENT_OTHER): Payer: Self-pay

## 2021-01-05 ENCOUNTER — Encounter: Payer: Self-pay | Admitting: Family Medicine

## 2021-01-05 ENCOUNTER — Other Ambulatory Visit: Payer: Self-pay

## 2021-01-05 DIAGNOSIS — Z1231 Encounter for screening mammogram for malignant neoplasm of breast: Secondary | ICD-10-CM | POA: Insufficient documentation

## 2021-01-05 DIAGNOSIS — E2839 Other primary ovarian failure: Secondary | ICD-10-CM | POA: Diagnosis not present

## 2021-01-05 DIAGNOSIS — M8589 Other specified disorders of bone density and structure, multiple sites: Secondary | ICD-10-CM | POA: Diagnosis not present

## 2021-01-05 DIAGNOSIS — Z780 Asymptomatic menopausal state: Secondary | ICD-10-CM | POA: Diagnosis not present

## 2021-02-27 ENCOUNTER — Encounter: Payer: Self-pay | Admitting: Family Medicine

## 2021-05-05 ENCOUNTER — Encounter: Payer: Self-pay | Admitting: Family Medicine

## 2021-05-12 ENCOUNTER — Encounter: Payer: Self-pay | Admitting: Family Medicine

## 2021-06-04 ENCOUNTER — Encounter: Payer: Self-pay | Admitting: Family Medicine

## 2021-06-07 ENCOUNTER — Ambulatory Visit (INDEPENDENT_AMBULATORY_CARE_PROVIDER_SITE_OTHER): Payer: Medicare Other | Admitting: Family Medicine

## 2021-06-07 ENCOUNTER — Other Ambulatory Visit: Payer: Self-pay

## 2021-06-07 VITALS — BP 132/76 | HR 78 | Resp 17 | Ht 62.0 in | Wt 169.0 lb

## 2021-06-07 DIAGNOSIS — J0111 Acute recurrent frontal sinusitis: Secondary | ICD-10-CM | POA: Diagnosis not present

## 2021-06-07 DIAGNOSIS — R404 Transient alteration of awareness: Secondary | ICD-10-CM

## 2021-06-07 DIAGNOSIS — E7800 Pure hypercholesterolemia, unspecified: Secondary | ICD-10-CM | POA: Diagnosis not present

## 2021-06-07 DIAGNOSIS — R7303 Prediabetes: Secondary | ICD-10-CM

## 2021-06-07 MED ORDER — AMOXICILLIN 500 MG PO CAPS: 1000.0000 mg | ORAL_CAPSULE | Freq: Two times a day (BID) | ORAL | 0 refills | Status: DC

## 2021-06-07 NOTE — Patient Instructions (Addendum)
It was good to see you today- we will try to figure out what caused these unusual episodes recently For now- labs -amoxicillin for sinus infection -take a baby aspirin daily  We will set up an MRI of your brain asap If you have any change in how you are feeling or any new symptoms please let us know asap
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
## 2021-06-08 ENCOUNTER — Encounter: Payer: Self-pay | Admitting: Family Medicine

## 2021-06-08 LAB — COMPREHENSIVE METABOLIC PANEL
ALT: 20 U/L (ref 0–35)
AST: 19 U/L (ref 0–37)
Albumin: 4.6 g/dL (ref 3.5–5.2)
Alkaline Phosphatase: 68 U/L (ref 39–117)
BUN: 18 mg/dL (ref 6–23)
CO2: 29 mEq/L (ref 19–32)
Calcium: 9.8 mg/dL (ref 8.4–10.5)
Chloride: 99 mEq/L (ref 96–112)
Creatinine, Ser: 0.82 mg/dL (ref 0.40–1.20)
GFR: 69.78 mL/min (ref >60.00)
Glucose, Bld: 87 mg/dL (ref 70–99)
Potassium: 4.6 mEq/L (ref 3.5–5.1)
Sodium: 140 mEq/L (ref 135–145)
Total Bilirubin: 0.5 mg/dL (ref 0.2–1.2)
Total Protein: 7.1 g/dL (ref 6.0–8.3)

## 2021-06-08 LAB — LIPID PANEL
Cholesterol: 270 mg/dL — ABNORMAL HIGH (ref 0–200)
HDL: 55.2 mg/dL (ref >39.00)
LDL Cholesterol: 193 mg/dL — ABNORMAL HIGH (ref 0–99)
NonHDL: 214.77
Total CHOL/HDL Ratio: 5
Triglycerides: 110 mg/dL (ref 0.0–149.0)
VLDL: 22 mg/dL (ref 0.0–40.0)

## 2021-06-08 LAB — CBC
HCT: 41.3 % (ref 36.0–46.0)
Hemoglobin: 13.7 g/dL (ref 12.0–15.0)
MCHC: 33.2 g/dL (ref 30.0–36.0)
MCV: 92 fl (ref 78.0–100.0)
Platelets: 491 10*3/uL — ABNORMAL HIGH (ref 150.0–400.0)
RBC: 4.48 Mil/uL (ref 3.87–5.11)
RDW: 15.1 % (ref 11.5–15.5)
WBC: 8.4 10*3/uL (ref 4.0–10.5)

## 2021-06-08 LAB — HEMOGLOBIN A1C: Hgb A1c MFr Bld: 6.1 % (ref 4.6–6.5)

## 2021-06-08 LAB — TSH: TSH: 2.05 u[IU]/mL (ref 0.35–5.50)

## 2021-06-09 DIAGNOSIS — H2513 Age-related nuclear cataract, bilateral: Secondary | ICD-10-CM | POA: Diagnosis not present

## 2021-06-10 ENCOUNTER — Ambulatory Visit (HOSPITAL_BASED_OUTPATIENT_CLINIC_OR_DEPARTMENT_OTHER)
Admission: RE | Admit: 2021-06-10 | Discharge: 2021-06-10 | Disposition: A | Discharge status: Home / Self Care | Payer: Medicare Other | Source: Ambulatory Visit | Attending: Family Medicine | Admitting: Family Medicine

## 2021-06-10 ENCOUNTER — Other Ambulatory Visit: Payer: Self-pay

## 2021-06-10 DIAGNOSIS — R404 Transient alteration of awareness: Secondary | ICD-10-CM | POA: Diagnosis not present

## 2021-06-10 DIAGNOSIS — R29818 Other symptoms and signs involving the nervous system: Secondary | ICD-10-CM | POA: Diagnosis not present

## 2021-06-11 ENCOUNTER — Encounter: Payer: Self-pay | Admitting: Family Medicine

## 2021-06-13 ENCOUNTER — Other Ambulatory Visit: Payer: Self-pay

## 2021-06-13 ENCOUNTER — Encounter (HOSPITAL_BASED_OUTPATIENT_CLINIC_OR_DEPARTMENT_OTHER): Payer: Self-pay | Admitting: Emergency Medicine

## 2021-06-13 ENCOUNTER — Emergency Department (HOSPITAL_BASED_OUTPATIENT_CLINIC_OR_DEPARTMENT_OTHER): Payer: Medicare Other

## 2021-06-13 ENCOUNTER — Emergency Department (HOSPITAL_BASED_OUTPATIENT_CLINIC_OR_DEPARTMENT_OTHER)
Admission: EM | Admit: 2021-06-13 | Discharge: 2021-06-13 | Disposition: A | Discharge status: Home / Self Care | Payer: Medicare Other | Attending: Emergency Medicine | Admitting: Emergency Medicine

## 2021-06-13 DIAGNOSIS — I10 Essential (primary) hypertension: Secondary | ICD-10-CM | POA: Diagnosis not present

## 2021-06-13 DIAGNOSIS — R0981 Nasal congestion: Secondary | ICD-10-CM | POA: Insufficient documentation

## 2021-06-13 DIAGNOSIS — Z79899 Other long term (current) drug therapy: Secondary | ICD-10-CM | POA: Insufficient documentation

## 2021-06-13 DIAGNOSIS — R0789 Other chest pain: Secondary | ICD-10-CM | POA: Insufficient documentation

## 2021-06-13 DIAGNOSIS — R079 Chest pain, unspecified: Secondary | ICD-10-CM

## 2021-06-13 DIAGNOSIS — M546 Pain in thoracic spine: Secondary | ICD-10-CM | POA: Diagnosis not present

## 2021-06-13 DIAGNOSIS — R9431 Abnormal electrocardiogram [ECG] [EKG]: Secondary | ICD-10-CM | POA: Diagnosis not present

## 2021-06-13 LAB — CBC WITH DIFFERENTIAL/PLATELET
Abs Immature Granulocytes: 0.04 10*3/uL (ref 0.00–0.07)
Basophils Absolute: 0 10*3/uL (ref 0.0–0.1)
Basophils Relative: 1 %
Eosinophils Absolute: 0.4 10*3/uL (ref 0.0–0.5)
Eosinophils Relative: 6 %
HCT: 42.7 % (ref 36.0–46.0)
Hemoglobin: 14.2 g/dL (ref 12.0–15.0)
Immature Granulocytes: 1 %
Lymphocytes Relative: 20 %
Lymphs Abs: 1.4 10*3/uL (ref 0.7–4.0)
MCH: 30.7 pg (ref 26.0–34.0)
MCHC: 33.3 g/dL (ref 30.0–36.0)
MCV: 92.4 fL (ref 80.0–100.0)
Monocytes Absolute: 0.5 10*3/uL (ref 0.1–1.0)
Monocytes Relative: 7 %
Neutro Abs: 4.7 10*3/uL (ref 1.7–7.7)
Neutrophils Relative %: 65 %
Platelets: 533 10*3/uL — ABNORMAL HIGH (ref 150–400)
RBC: 4.62 MIL/uL (ref 3.87–5.11)
RDW: 14.1 % (ref 11.5–15.5)
WBC: 7.1 10*3/uL (ref 4.0–10.5)
nRBC: 0 % (ref 0.0–0.2)

## 2021-06-13 LAB — MAGNESIUM: Magnesium: 2 mg/dL (ref 1.7–2.4)

## 2021-06-13 LAB — TROPONIN I (HIGH SENSITIVITY)
Troponin I (High Sensitivity): 3 ng/L (ref <18)
Troponin I (High Sensitivity): 4 ng/L (ref <18)

## 2021-06-13 LAB — COMPREHENSIVE METABOLIC PANEL
ALT: 23 U/L (ref 0–44)
AST: 24 U/L (ref 15–41)
Albumin: 4.6 g/dL (ref 3.5–5.0)
Alkaline Phosphatase: 69 U/L (ref 38–126)
Anion gap: 10 (ref 5–15)
BUN: 17 mg/dL (ref 8–23)
CO2: 29 mmol/L (ref 22–32)
Calcium: 9.3 mg/dL (ref 8.9–10.3)
Chloride: 100 mmol/L (ref 98–111)
Creatinine, Ser: 0.74 mg/dL (ref 0.44–1.00)
GFR, Estimated: >60 mL/min (ref >60)
Glucose, Bld: 92 mg/dL (ref 70–99)
Potassium: 4 mmol/L (ref 3.5–5.1)
Sodium: 139 mmol/L (ref 135–145)
Total Bilirubin: 0.5 mg/dL (ref 0.3–1.2)
Total Protein: 7.2 g/dL (ref 6.5–8.1)

## 2021-06-13 LAB — LIPASE, BLOOD: Lipase: 33 U/L (ref 11–51)
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
## 2021-06-13 NOTE — ED Triage Notes (Signed)
Had MRI on Sat , has sinus infection and taking claritin ,  since Sunday has felt pain  between shoulder blades,  having upper abd pain and chest pain discomfort  last night,  felt tired  this am left arm

## 2021-06-13 NOTE — ED Notes (Signed)
Patient left ED ambulatory with steady independent gait and is A/O. 

## 2021-06-13 NOTE — ED Notes (Signed)
Patient reports left anterior shoulder pain/soreness and mid upper back pain that improves with massage. Lungs are clear to auscultation. Patient denies chest pain and has no pain with palpation of the chest or left shoulder.

## 2021-07-17 ENCOUNTER — Encounter: Payer: Self-pay | Admitting: Gastroenterology

## 2021-09-25 ENCOUNTER — Ambulatory Visit (INDEPENDENT_AMBULATORY_CARE_PROVIDER_SITE_OTHER): Payer: Medicare Other

## 2021-09-25 VITALS — Ht 62.0 in | Wt 169.0 lb

## 2021-09-25 DIAGNOSIS — Z0000 Encounter for general adult medical examination without abnormal findings: Secondary | ICD-10-CM

## 2021-09-25 NOTE — Progress Notes (Signed)
Subjective:   Lindsey Shepard is a 75 y.o. female who presents for an Initial Medicare Annual Wellness Visit.  I connected with Shahrzad today by telephone and verified that I am speaking with the correct person using two identifiers. Location patient: home Location provider: work Persons participating in the virtual visit: patient, Marine scientist.    I discussed the limitations, risks, security and privacy concerns of performing an evaluation and management service by telephone and the availability of in person appointments. I also discussed with the patient that there may be a patient responsible charge related to this service. The patient expressed understanding and verbally consented to this telephonic visit.    Interactive audio and video telecommunications were attempted between this provider and patient, however failed, due to patient having technical difficulties OR patient did not have access to video capability.  We continued and completed visit with audio only.  Some vital signs may be absent or patient reported.   Time Spent with patient on telephone encounter: 25 minutes   Review of Systems     Cardiac Risk Factors include: advanced age (>88men, >67 women);dyslipidemia;hypertension;obesity (BMI >30kg/m2)     Objective:    Today's Vitals   09/25/21 1100 09/25/21 1105  Weight: 169 lb (76.7 kg)   Height: 5\' 2"  (1.575 m)   PainSc:  2    Body mass index is 30.91 kg/m.  Advanced Directives 09/25/2021  Does Patient Have a Medical Advance Directive? Yes  Type of Paramedic of [place];Living will  Copy of [place] in Chart? No - copy requested    Current Medications (verified) Outpatient Encounter Medications as of 09/25/2021  Medication Sig   acetaminophen (TYLENOL) 325 MG tablet Take 650 mg by mouth every 6 (six) hours as needed.   benazepril-hydrochlorthiazide (LOTENSIN HCT) 20-12.5 MG tablet Take 2 tablets by mouth daily.    glucosamine-chondroitin 500-400 MG tablet Take 1 tablet by mouth daily.   Multiple Vitamin (MULTI-VITAMINS) TABS Take by mouth.   naproxen (NAPROSYN) 250 MG tablet Take by mouth as needed.    Omega-3 Fatty Acids (FISH OIL) 1000 MG CAPS 1 TAB DAILY   OVER THE COUNTER MEDICATION OTC Calcium and Magnesium taking 2 tabs daily   [DISCONTINUED] amoxicillin (AMOXIL) 500 MG capsule Take 2 capsules (1,000 mg total) by mouth 2 (two) times daily.   No facility-administered encounter medications on file as of 09/25/2021.    Allergies (verified) Hydrocodone-acetaminophen, Influenza vaccines, Motrin [ibuprofen], Nabumetone, Percocet [oxycodone-acetaminophen], and Vicodin [hydrocodone-acetaminophen]   History: Past Medical History:  Diagnosis Date   Anxiety    Arthritis    Diverticulitis    Hyperlipidemia    Hypertension    Other abnormal clinical finding    mirco preforation with diverticulitis   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   Past Surgical History:  Procedure Laterality Date   ABDOMINAL HYSTERECTOMY  1997   per Dr Margaretha Glassing    BREAST BIOPSY     fracture left arm     with hardware   left arm surgery  2009   reflex sympathetic dystrophy- 9 surgeries   NEPHRECTOMY  1993   right   Family History  Problem Relation Age of Onset   Heart disease Mother    Diabetes Father    Pancreatic cancer Father    Heart disease Brother    Social History   Socioeconomic History   Marital status: Widowed    Spouse name: Not on file   Number of children:  1   Years of education: BS   Highest education level: Not on file  Occupational History   Occupation: Retired  Tobacco Use   Smoking status: Never   Smokeless tobacco: Never  Vaping Use   Vaping Use: Never used  Substance and Sexual Activity   Alcohol use: Yes    Alcohol/week: 4.0 standard drinks    Types: 4 Glasses of wine per week    Comment: 1 glass/day   Drug use: No   Sexual activity: Not on file  Other Topics Concern   Not on file   Social History Narrative   Right handed    Coffee every morning   Lives alone   Social Determinants of Health   Financial Resource Strain: Low Risk    Difficulty of Paying Living Expenses: Not hard at all  Food Insecurity: No Food Insecurity   Worried About Charity fundraiser in the Last Year: Never true   Ran Out of Food in the Last Year: Never true  Transportation Needs: No Transportation Needs   Lack of Transportation (Medical): No   Lack of Transportation (Non-Medical): No  Physical Activity: Sufficiently Active   Days of Exercise per Week: 7 days   Minutes of Exercise per Session: 30 min  Stress: No Stress Concern Present   Feeling of Stress : Not at all  Social Connections: Socially Isolated   Frequency of Communication with Friends and Family: More than three times a week   Frequency of Social Gatherings with Friends and Family: More than three times a week   Attends Religious Services: Never   Marine scientist or Organizations: No   Attends Archivist Meetings: Never   Marital Status: Widowed    Tobacco Counseling Counseling given: Not Answered   Clinical Intake:  Pre-visit preparation completed: Yes  Pain : 0-10 Pain Score: 2  Pain Type: Chronic pain Pain Location: Knee Pain Orientation: Right Pain Onset: More than a month ago Pain Frequency: Intermittent     BMI - recorded: 30.91 Nutritional Status: BMI > 30  Obese Nutritional Risks: None Diabetes: No  How often do you need to have someone help you when you read instructions, pamphlets, or other written materials from your doctor or pharmacy?: 1 - Never  Diabetic?No  Interpreter Needed?: No  Information entered by :: Caroleen Hamman LPN   Activities of Daily Living In your present state of health, do you have any difficulty performing the following activities: 09/25/2021  Hearing? N  Vision? N  Difficulty concentrating or making decisions? N  Walking or climbing stairs? N   Dressing or bathing? N  Doing errands, shopping? N  Preparing Food and eating ? N  Using the Toilet? N  In the past six months, have you accidently leaked urine? N  Do you have problems with loss of bowel control? N  Managing your Medications? N  Managing your Finances? N  Housekeeping or managing your Housekeeping? N  Some recent data might be hidden    Patient Care Team: Copland, Gay Filler, MD as PCP - General (Family Medicine)  Indicate any recent Medical Services you may have received from other than [facility] providers in the past year (date may be approximate).     Assessment:   This is a routine wellness examination for [place].  Hearing/Vision screen Vision Screening - Comments:: Last eye exam-2022  Dietary issues and exercise activities discussed: Current Exercise Habits: Home exercise routine, Type of exercise: walking;strength training/weights;stretching, Time (Minutes): 30,  Frequency (Times/Week): 7, Weekly Exercise (Minutes/Week): 210, Intensity: Mild, Exercise limited by: None identified   Goals Addressed             This Visit's Progress    Patient Stated       Continue exercising       Depression Screen PHQ 2/9 Scores 09/25/2021 09/15/2020 11/21/2017 04/30/2016 11/08/2015 03/14/2015 03/15/2014  PHQ - 2 Score 1 0 0 0 0 0 0    Fall Risk Fall Risk  09/25/2021 09/15/2020 11/21/2017 04/30/2016 03/15/2014  Falls in the past year? 0 0 No Yes No  Number falls in past yr: 0 0 - 1 -  Injury with Fall? 0 0 - Yes -  Comment - - - Bruised rib -  Follow up Falls prevention discussed - - - -    FALL RISK PREVENTION PERTAINING TO THE HOME:  Any stairs in or around the home? Yes  If so, are there any without handrails? No  Home free of loose throw rugs in walkways, pet beds, electrical cords, etc? Yes  Adequate lighting in your home to reduce risk of falls? Yes   ASSISTIVE DEVICES UTILIZED TO PREVENT FALLS:  Life alert? No  Use of a cane, walker or w/c? No  Grab bars  in the bathroom? No  Shower chair or bench in shower? No  Elevated toilet seat or a handicapped toilet? No   TIMED UP AND GO:  Was the test performed? No . Phone visit   Cognitive Function:        Immunizations Immunization History  Administered Date(s) Administered   Influenza Split 08/26/2017   Influenza, High Dose Seasonal PF 08/25/2018   Influenza,inj,Quad PF,6+ Mos 08/26/2017, 08/17/2019   Influenza-Unspecified 08/11/2015, 08/19/2020, 08/14/2021   Moderna Sars-Covid-2 Vaccination 01/06/2020, 02/04/2020   Pneumococcal Conjugate-13 03/14/2015   Pneumococcal Polysaccharide-23 04/30/2016   Td 05/01/2017    TDAP status: Up to date  Flu Vaccine status: Up to date  Pneumococcal vaccine status: Up to date  Covid-19 vaccine status: Declined, Education has been provided regarding the importance of this vaccine but patient still declined. Advised may receive this vaccine at local pharmacy or Health Dept.or vaccine clinic. Aware to provide a copy of the vaccination record if obtained from local pharmacy or Health Dept. Verbalized acceptance and understanding.  Qualifies for Shingles Vaccine? Yes   Zostavax completed No   Shingrix Completed?: No.    Education has been provided regarding the importance of this vaccine. Patient has been advised to call insurance company to determine out of pocket expense if they have not yet received this vaccine. Advised may also receive vaccine at local pharmacy or Health Dept. Verbalized acceptance and understanding.  Screening Tests Health Maintenance  Topic Date Due   Zoster Vaccines- Shingrix (1 of 2) Never done   COVID-19 Vaccine (3 - Booster for Moderna series) 10/11/2021 (Originally 03/31/2020)   TETANUS/TDAP  05/02/2027   Pneumonia Vaccine 57+ Years old  Completed   INFLUENZA VACCINE  Completed   DEXA SCAN  Completed   Hepatitis C Screening  Completed   HPV VACCINES  Aged Out   COLONOSCOPY (Pts 45-29yrs Insurance coverage will need to  be confirmed)  Discontinued    Health Maintenance  Health Maintenance Due  Topic Date Due   Zoster Vaccines- Shingrix (1 of 2) Never done    Colorectal cancer screening: No longer required.   Mammogram status: Completed bilateral 01/05/2021. Repeat every year  Bone Density status: Completed 01/05/2021. Results reflect: Bone density results:  OSTEOPENIA. Repeat every 2 years.  Lung Cancer Screening: (Low Dose CT Chest recommended if Age 26-80 years, 30 pack-year currently smoking OR have quit w/in 15years.) does not qualify.     Additional Screening:  Hepatitis C Screening: Completed 04/30/2016  Vision Screening: Recommended annual ophthalmology exams for early detection of glaucoma and other disorders of the eye. Is the patient up to date with their annual eye exam?  Yes  Who is the provider or what is the name of the office in which the patient attends annual eye exams? Dr. Daron Offer   Dental Screening: Recommended annual dental exams for proper oral hygiene  Community Resource Referral / Chronic Care Management: CRR required this visit?  No   CCM required this visit?  No      Plan:     I have personally reviewed and noted the following in the patient's chart:   Medical and social history Use of alcohol, tobacco or illicit drugs  Current medications and supplements including opioid prescriptions. Patient is not currently taking opioid prescriptions. Functional ability and status Nutritional status Physical activity Advanced directives List of other physicians Hospitalizations, surgeries, and ER visits in previous 12 months Vitals Screenings to include cognitive, depression, and falls Referrals and appointments  In addition, I have reviewed and discussed with patient certain preventive protocols, quality metrics, and best practice recommendations. A written personalized care plan for preventive services as well as general preventive health recommendations were provided  to patient.   Due to this being a telephonic visit, the after visit summary with patients personalized plan was offered to patient via mail or my-chart. Patient would like to access on my-chart.   Marta Antu, LPN   [id number]  Nurse Health Advisor  Nurse Notes: None

## 2021-09-25 NOTE — Patient Instructions (Signed)
Lindsey Shepard , Thank you for taking time to complete your Medicare Wellness Visit. I appreciate your ongoing commitment to your health goals. Please review the following plan we discussed and let me know if I can assist you in the future.   Screening recommendations/referrals: Colonoscopy: No longer required Mammogram: Completed 01/05/2021-Due 01/05/2022 Bone Density: Completed 01/05/2021-due 01/06/2023 Recommended yearly ophthalmology/optometry visit for glaucoma screening and checkup Recommended yearly dental visit for hygiene and checkup  Vaccinations: Influenza vaccine: Up to date Pneumococcal vaccine: Up to date Tdap vaccine: Up to date Shingles vaccine: Discuss with pharmacy   Covid-19:Declined  Advanced directives: Please bring a copy of Living Will and/or Healthcare Power of Attorney for your chart.   Conditions/risks identified: See problem list  Next appointment: Follow up in one year for your annual wellness visit    Preventive Care 65 Years and Older, Female Preventive care refers to lifestyle choices and visits with your health care provider that can promote health and wellness. What does preventive care include? A yearly physical exam. This is also called an annual well check. Dental exams once or twice a year. Routine eye exams. Ask your health care provider how often you should have your eyes checked. Personal lifestyle choices, including: Daily care of your teeth and gums. Regular physical activity. Eating a healthy diet. Avoiding tobacco and drug use. Limiting alcohol use. Practicing safe sex. Taking low-dose aspirin every day. Taking vitamin and mineral supplements as recommended by your health care provider. What happens during an annual well check? The services and screenings done by your health care provider during your annual well check will depend on your age, overall health, lifestyle risk factors, and family history of disease. Counseling  Your health care  provider may ask you questions about your: Alcohol use. Tobacco use. Drug use. Emotional well-being. Home and relationship well-being. Sexual activity. Eating habits. History of falls. Memory and ability to understand (cognition). Work and work Statistician. Reproductive health. Screening  You may have the following tests or measurements: Height, weight, and BMI. Blood pressure. Lipid and cholesterol levels. These may be checked every 5 years, or more frequently if you are over 45 years old. Skin check. Lung cancer screening. You may have this screening every year starting at age 37 if you have a 30-pack-year history of smoking and currently smoke or have quit within the past 15 years. Fecal occult blood test (FOBT) of the stool. You may have this test every year starting at age 5. Flexible sigmoidoscopy or colonoscopy. You may have a sigmoidoscopy every 5 years or a colonoscopy every 10 years starting at age 19. Hepatitis C blood test. Hepatitis B blood test. Sexually transmitted disease (STD) testing. Diabetes screening. This is done by checking your blood sugar (glucose) after you have not eaten for a while (fasting). You may have this done every 1-3 years. Bone density scan. This is done to screen for osteoporosis. You may have this done starting at age 71. Mammogram. This may be done every 1-2 years. Talk to your health care provider about how often you should have regular mammograms. Talk with your health care provider about your test results, treatment options, and if necessary, the need for more tests. Vaccines  Your health care provider may recommend certain vaccines, such as: Influenza vaccine. This is recommended every year. Tetanus, diphtheria, and acellular pertussis (Tdap, Td) vaccine. You may need a Td booster every 10 years. Zoster vaccine. You may need this after age 91. Pneumococcal 13-valent conjugate (PCV13) vaccine.  One dose is recommended after age  54. Pneumococcal polysaccharide (PPSV23) vaccine. One dose is recommended after age 80. Talk to your health care provider about which screenings and vaccines you need and how often you need them. This information is not intended to replace advice given to you by your health care provider. Make sure you discuss any questions you have with your health care provider. Document Released: 11/18/2015 Document Revised: 07/11/2016 Document Reviewed: 08/23/2015 Elsevier Interactive Patient Education  2017 [place] Prevention in the Home Falls can cause injuries. They can happen to people of all ages. There are many things you can do to make your home safe and to help prevent falls. What can I do on the outside of my home? Regularly fix the edges of walkways and driveways and fix any cracks. Remove anything that might make you trip as you walk through a door, such as a raised step or threshold. Trim any bushes or trees on the path to your home. Use bright outdoor lighting. Clear any walking paths of anything that might make someone trip, such as rocks or tools. Regularly check to see if handrails are loose or broken. Make sure that both sides of any steps have handrails. Any raised decks and porches should have guardrails on the edges. Have any leaves, snow, or ice cleared regularly. Use sand or salt on walking paths during winter. Clean up any spills in your garage right away. This includes oil or grease spills. What can I do in the bathroom? Use night lights. Install grab bars by the toilet and in the tub and shower. Do not use towel bars as grab bars. Use non-skid mats or decals in the tub or shower. If you need to sit down in the shower, use a plastic, non-slip stool. Keep the floor dry. Clean up any water that spills on the floor as soon as it happens. Remove soap buildup in the tub or shower regularly. Attach bath mats securely with double-sided non-slip rug tape. Do not have throw  rugs and other things on the floor that can make you trip. What can I do in the bedroom? Use night lights. Make sure that you have a light by your bed that is easy to reach. Do not use any sheets or blankets that are too big for your bed. They should not hang down onto the floor. Have a firm chair that has side arms. You can use this for support while you get dressed. Do not have throw rugs and other things on the floor that can make you trip. What can I do in the kitchen? Clean up any spills right away. Avoid walking on wet floors. Keep items that you use a lot in easy-to-reach places. If you need to reach something above you, use a strong step stool that has a grab bar. Keep electrical cords out of the way. Do not use floor polish or wax that makes floors slippery. If you must use wax, use non-skid floor wax. Do not have throw rugs and other things on the floor that can make you trip. What can I do with my stairs? Do not leave any items on the stairs. Make sure that there are handrails on both sides of the stairs and use them. Fix handrails that are broken or loose. Make sure that handrails are as long as the stairways. Check any carpeting to make sure that it is firmly attached to the stairs. Fix any carpet that is loose or  worn. Avoid having throw rugs at the top or bottom of the stairs. If you do have throw rugs, attach them to the floor with carpet tape. Make sure that you have a light switch at the top of the stairs and the bottom of the stairs. If you do not have them, ask someone to add them for you. What else can I do to help prevent falls? Wear shoes that: Do not have high heels. Have rubber bottoms. Are comfortable and fit you well. Are closed at the toe. Do not wear sandals. If you use a stepladder: Make sure that it is fully opened. Do not climb a closed stepladder. Make sure that both sides of the stepladder are locked into place. Ask someone to hold it for you, if  possible. Clearly mark and make sure that you can see: Any grab bars or handrails. First and last steps. Where the edge of each step is. Use tools that help you move around (mobility aids) if they are needed. These include: Canes. Walkers. Scooters. Crutches. Turn on the lights when you go into a dark area. Replace any light bulbs as soon as they burn out. Set up your furniture so you have a clear path. Avoid moving your furniture around. If any of your floors are uneven, fix them. If there are any pets around you, be aware of where they are. Review your medicines with your doctor. Some medicines can make you feel dizzy. This can increase your chance of falling. Ask your doctor what other things that you can do to help prevent falls. This information is not intended to replace advice given to you by your health care provider. Make sure you discuss any questions you have with your health care provider. Document Released: 08/18/2009 Document Revised: 03/29/2016 Document Reviewed: 11/26/2014 Elsevier Interactive Patient Education  2017 [employer].

## 2021-10-03 ENCOUNTER — Other Ambulatory Visit: Payer: Self-pay | Admitting: Family Medicine

## 2021-10-03 DIAGNOSIS — I10 Essential (primary) hypertension: Secondary | ICD-10-CM

## 2021-12-23 ENCOUNTER — Encounter: Payer: Self-pay | Admitting: Family Medicine

## 2021-12-24 ENCOUNTER — Encounter: Payer: Self-pay | Admitting: Family Medicine

## 2022-01-08 ENCOUNTER — Ambulatory Visit: Payer: Federal, State, Local not specified - PPO

## 2022-01-08 ENCOUNTER — Ambulatory Visit (INDEPENDENT_AMBULATORY_CARE_PROVIDER_SITE_OTHER): Payer: Medicare Other | Admitting: Orthopaedic Surgery

## 2022-01-08 VITALS — Ht 62.0 in | Wt 169.0 lb

## 2022-01-08 DIAGNOSIS — M25561 Pain in right knee: Secondary | ICD-10-CM

## 2022-01-08 DIAGNOSIS — G8929 Other chronic pain: Secondary | ICD-10-CM

## 2022-01-08 MED ORDER — LIDOCAINE HCL 1 % IJ SOLN
3.0000 mL | INTRAMUSCULAR | Status: AC | PRN
  Administered 2022-01-08: 3 mL

## 2022-01-08 MED ORDER — METHYLPREDNISOLONE ACETATE 40 MG/ML IJ SUSP
40.0000 mg | INTRAMUSCULAR | Status: AC | PRN
Start: 2022-01-08 — End: 2022-01-08
  Administered 2022-01-08: 40 mg via INTRA_ARTICULAR
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
## 2022-02-05 ENCOUNTER — Other Ambulatory Visit: Payer: Self-pay

## 2022-02-05 ENCOUNTER — Encounter: Payer: Self-pay | Admitting: Orthopaedic Surgery

## 2022-02-05 ENCOUNTER — Ambulatory Visit (INDEPENDENT_AMBULATORY_CARE_PROVIDER_SITE_OTHER): Payer: Medicare Other | Admitting: Orthopaedic Surgery

## 2022-02-05 DIAGNOSIS — G8929 Other chronic pain: Secondary | ICD-10-CM | POA: Diagnosis not present

## 2022-02-05 DIAGNOSIS — M25561 Pain in right knee: Secondary | ICD-10-CM

## 2022-02-05 NOTE — Progress Notes (Signed)
The patient continues to follow-up with severe right knee pain with swelling and locking catching.  She is a very active 76 year old female.  X-rays of her knee showed a well-maintained medial compartment.  That is where most of her pain is.  She is getting a lot of pain at night and she is very active.  We tried conservative treatment with activity modification and quad strengthening exercises.  She has taken anti-inflammatories and tried a steroid injection in her knee.  The steroid injection helped for about a week but now her pain is back and is severe with her left knee.  Is been suspicious for having meniscal tear. ? ?Given the fact that she has tried and failed conservative treatment for now well over 6 to 12 weeks, a MRI of her right knee is warranted to rule out a meniscal tear.  On repeat exam she does have medial joint line tenderness and a positive Murray's on the medial compartment of the knee.  There is a moderate effusion as well. ? ?We will send her for an MRI of her right knee at this standpoint to assess the cartilage and rule out meniscal tear or even a stress fracture given her age.  We will see her back in follow-up after the MRI.  She will continue activity modification in the interim. ?

## 2022-02-16 ENCOUNTER — Ambulatory Visit
Admission: RE | Admit: 2022-02-16 | Discharge: 2022-02-16 | Disposition: A | Discharge status: Home / Self Care | Payer: Federal, State, Local not specified - PPO | Source: Ambulatory Visit | Attending: Orthopaedic Surgery | Admitting: Orthopaedic Surgery

## 2022-02-16 DIAGNOSIS — M1711 Unilateral primary osteoarthritis, right knee: Secondary | ICD-10-CM | POA: Diagnosis not present

## 2022-02-16 DIAGNOSIS — M25461 Effusion, right knee: Secondary | ICD-10-CM | POA: Diagnosis not present

## 2022-02-16 DIAGNOSIS — G8929 Other chronic pain: Secondary | ICD-10-CM

## 2022-02-16 DIAGNOSIS — R600 Localized edema: Secondary | ICD-10-CM | POA: Diagnosis not present

## 2022-02-21 ENCOUNTER — Ambulatory Visit (INDEPENDENT_AMBULATORY_CARE_PROVIDER_SITE_OTHER): Payer: Medicare Other | Admitting: Orthopaedic Surgery

## 2022-02-21 ENCOUNTER — Ambulatory Visit: Payer: Medicare Other | Admitting: Physician Assistant

## 2022-02-21 ENCOUNTER — Encounter: Payer: Self-pay | Admitting: Orthopaedic Surgery

## 2022-02-21 DIAGNOSIS — M25561 Pain in right knee: Secondary | ICD-10-CM

## 2022-02-21 DIAGNOSIS — G8929 Other chronic pain: Secondary | ICD-10-CM | POA: Diagnosis not present
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
## 2022-02-23 ENCOUNTER — Ambulatory Visit (INDEPENDENT_AMBULATORY_CARE_PROVIDER_SITE_OTHER): Payer: Medicare Other | Admitting: Orthopaedic Surgery

## 2022-02-23 ENCOUNTER — Encounter (HOSPITAL_BASED_OUTPATIENT_CLINIC_OR_DEPARTMENT_OTHER): Payer: Self-pay | Admitting: Orthopaedic Surgery

## 2022-02-23 DIAGNOSIS — M25561 Pain in right knee: Secondary | ICD-10-CM | POA: Diagnosis not present

## 2022-02-23 DIAGNOSIS — M23006 Cystic meniscus, unspecified meniscus, right knee: Secondary | ICD-10-CM

## 2022-02-23 MED ORDER — TRIAMCINOLONE ACETONIDE 40 MG/ML IJ SUSP
80.0000 mg | INTRAMUSCULAR | Status: AC | PRN
Start: 2022-02-23 — End: 2022-02-23
  Administered 2022-02-23: 80 mg via INTRA_ARTICULAR

## 2022-02-23 MED ORDER — LIDOCAINE HCL 1 % IJ SOLN
4.0000 mL | INTRAMUSCULAR | Status: AC | PRN
Start: 2022-02-23 — End: 2022-02-23
  Administered 2022-02-23: 4 mL
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
## 2022-03-07 ENCOUNTER — Encounter: Payer: Self-pay | Admitting: Family Medicine
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
## 2022-03-12 ENCOUNTER — Ambulatory Visit (INDEPENDENT_AMBULATORY_CARE_PROVIDER_SITE_OTHER): Payer: Medicare Other | Admitting: Family Medicine

## 2022-03-12 VITALS — BP 122/70 | HR 78 | Temp 98.1°F | Resp 18 | Ht 62.0 in | Wt 164.4 lb

## 2022-03-12 DIAGNOSIS — I10 Essential (primary) hypertension: Secondary | ICD-10-CM | POA: Diagnosis not present

## 2022-03-12 DIAGNOSIS — J329 Chronic sinusitis, unspecified: Secondary | ICD-10-CM | POA: Diagnosis not present

## 2022-03-12 DIAGNOSIS — E7800 Pure hypercholesterolemia, unspecified: Secondary | ICD-10-CM

## 2022-03-12 DIAGNOSIS — R7989 Other specified abnormal findings of blood chemistry: Secondary | ICD-10-CM

## 2022-03-12 DIAGNOSIS — R7303 Prediabetes: Secondary | ICD-10-CM

## 2022-03-12 DIAGNOSIS — R252 Cramp and spasm: Secondary | ICD-10-CM

## 2022-03-12 DIAGNOSIS — J302 Other seasonal allergic rhinitis: Secondary | ICD-10-CM | POA: Diagnosis not present

## 2022-03-12 DIAGNOSIS — D75839 Thrombocytosis, unspecified: Secondary | ICD-10-CM | POA: Diagnosis not present

## 2022-03-12 MED ORDER — AMOXICILLIN 500 MG PO CAPS: 1000.0000 mg | ORAL_CAPSULE | Freq: Two times a day (BID) | ORAL | 0 refills | Status: DC

## 2022-03-12 NOTE — Patient Instructions (Addendum)
Let's have you add a nasal steroid spray for your allergies- I also placed a referral for an allergist evaluation ?We will also try a course of amoxicillin for you ? ?For cramping- if we can't find a specific explanation you can try ?Pickle juice ?Tonic water (recommend diet) ?Yellow mustard  ? ?If not done already I would  suggest getting the shingles vaccine (shingrix) at your pharmacy and a covid booster if not done in the last 4-6 months  ?

## 2022-03-13 ENCOUNTER — Encounter: Payer: Self-pay | Admitting: Family Medicine

## 2022-03-13 LAB — COMPREHENSIVE METABOLIC PANEL
ALT: 21 U/L (ref 0–35)
AST: 21 U/L (ref 0–37)
Albumin: 4.6 g/dL (ref 3.5–5.2)
Alkaline Phosphatase: 69 U/L (ref 39–117)
BUN: 21 mg/dL (ref 6–23)
CO2: 29 mEq/L (ref 19–32)
Calcium: 9.8 mg/dL (ref 8.4–10.5)
Chloride: 99 mEq/L (ref 96–112)
Creatinine, Ser: 0.86 mg/dL (ref 0.40–1.20)
GFR: 65.55 mL/min (ref >60.00)
Glucose, Bld: 75 mg/dL (ref 70–99)
Potassium: 4.9 mEq/L (ref 3.5–5.1)
Sodium: 138 mEq/L (ref 135–145)
Total Bilirubin: 0.5 mg/dL (ref 0.2–1.2)
Total Protein: 6.7 g/dL (ref 6.0–8.3)

## 2022-03-13 LAB — CBC
HCT: 42.4 % (ref 36.0–46.0)
Hemoglobin: 13.9 g/dL (ref 12.0–15.0)
MCHC: 32.9 g/dL (ref 30.0–36.0)
MCV: 93.5 fl (ref 78.0–100.0)
Platelets: 723 10*3/uL — ABNORMAL HIGH (ref 150.0–400.0)
RBC: 4.53 Mil/uL (ref 3.87–5.11)
RDW: 15.3 % (ref 11.5–15.5)
WBC: 10.9 10*3/uL — ABNORMAL HIGH (ref 4.0–10.5)

## 2022-03-13 LAB — LIPID PANEL
Cholesterol: 263 mg/dL — ABNORMAL HIGH (ref 0–200)
HDL: 61.4 mg/dL (ref >39.00)
LDL Cholesterol: 182 mg/dL — ABNORMAL HIGH (ref 0–99)
NonHDL: 201.37
Total CHOL/HDL Ratio: 4
Triglycerides: 96 mg/dL (ref 0.0–149.0)
VLDL: 19.2 mg/dL (ref 0.0–40.0)

## 2022-03-13 LAB — FERRITIN: Ferritin: 53.9 ng/mL (ref 10.0–291.0)

## 2022-03-13 LAB — MAGNESIUM: Magnesium: 2.1 mg/dL (ref 1.5–2.5)

## 2022-03-13 LAB — HEMOGLOBIN A1C: Hgb A1c MFr Bld: 6 % (ref 4.6–6.5)

## 2022-03-13 NOTE — Addendum Note (Signed)
Addended by: Lamar Blinks C on: 03/13/2022 11:47 AM ? ? Modules accepted: Orders ? ?

## 2022-03-19 ENCOUNTER — Encounter: Payer: Self-pay | Admitting: Family Medicine

## 2022-03-21 ENCOUNTER — Encounter: Payer: Self-pay | Admitting: Family Medicine

## 2022-03-25 ENCOUNTER — Encounter: Payer: Self-pay | Admitting: Family Medicine

## 2022-03-26 ENCOUNTER — Other Ambulatory Visit: Payer: Self-pay | Admitting: Family Medicine

## 2022-03-26 MED ORDER — CLOTRIMAZOLE 10 MG MT TROC: 10.0000 mg | Freq: Every day | OROMUCOSAL | 0 refills | Status: DC

## 2022-03-26 NOTE — Addendum Note (Signed)
Addended by: Darreld Mclean on: 03/26/2022 06:17 PM   Modules accepted: Orders

## 2022-03-30 ENCOUNTER — Ambulatory Visit (INDEPENDENT_AMBULATORY_CARE_PROVIDER_SITE_OTHER): Payer: Medicare Other | Admitting: Orthopaedic Surgery

## 2022-03-30 DIAGNOSIS — M23006 Cystic meniscus, unspecified meniscus, right knee: Secondary | ICD-10-CM | POA: Diagnosis not present
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
## 2022-04-22 ENCOUNTER — Emergency Department (HOSPITAL_BASED_OUTPATIENT_CLINIC_OR_DEPARTMENT_OTHER)
Admission: EM | Admit: 2022-04-22 | Discharge: 2022-04-22 | Disposition: A | Discharge status: Home / Self Care | Payer: Medicare Other | Attending: Emergency Medicine | Admitting: Emergency Medicine

## 2022-04-22 ENCOUNTER — Other Ambulatory Visit: Payer: Self-pay

## 2022-04-22 DIAGNOSIS — T782XXA Anaphylactic shock, unspecified, initial encounter: Secondary | ICD-10-CM

## 2022-04-22 DIAGNOSIS — I10 Essential (primary) hypertension: Secondary | ICD-10-CM | POA: Diagnosis not present

## 2022-04-22 DIAGNOSIS — T7840XA Allergy, unspecified, initial encounter: Secondary | ICD-10-CM | POA: Diagnosis present

## 2022-04-22 DIAGNOSIS — Z79899 Other long term (current) drug therapy: Secondary | ICD-10-CM | POA: Diagnosis not present

## 2022-04-22 MED ORDER — PREDNISONE 10 MG PO TABS: 30.0000 mg | ORAL_TABLET | Freq: Every day | ORAL | 0 refills | Status: AC

## 2022-04-22 MED ORDER — FAMOTIDINE IN NACL 20-0.9 MG/50ML-% IV SOLN
20.0000 mg | Freq: Once | INTRAVENOUS | Status: AC
  Administered 2022-04-22: 20 mg via INTRAVENOUS
  Filled 2022-04-22: qty 50

## 2022-04-22 MED ORDER — EPINEPHRINE 0.3 MG/0.3ML IJ SOAJ
0.3000 mg | Freq: Once | INTRAMUSCULAR | Status: AC
  Administered 2022-04-22: 0.3 mg via INTRAMUSCULAR
  Filled 2022-04-22: qty 0.3

## 2022-04-22 MED ORDER — DIPHENHYDRAMINE HCL 50 MG/ML IJ SOLN
50.0000 mg | Freq: Once | INTRAMUSCULAR | Status: AC
  Administered 2022-04-22: 50 mg via INTRAVENOUS
  Filled 2022-04-22: qty 1

## 2022-04-22 MED ORDER — EPINEPHRINE 0.3 MG/0.3ML IJ SOAJ: 0.3000 mg | INTRAMUSCULAR | 0 refills | Status: DC | PRN

## 2022-04-22 MED ORDER — METHYLPREDNISOLONE SODIUM SUCC 125 MG IJ SOLR
125.0000 mg | Freq: Once | INTRAMUSCULAR | Status: AC
  Administered 2022-04-22: 125 mg via INTRAVENOUS
  Filled 2022-04-22: qty 2

## 2022-04-22 NOTE — ED Triage Notes (Signed)
Patient c/o right leg bee sting from yesterday. Also states she is taking care of neighbors cats that have been irritating her normal airborne allergies (coughing, nasal congestion). Scheduled for allergy test Tuesday.

## 2022-04-22 NOTE — ED Provider Notes (Signed)
  [facility] Provider Note   CSN: [id number] Arrival date & time: 04/22/22  0845     History {Add pertinent medical, surgical, social history, OB history to HPI:1} No chief complaint on file.   Lindsey Shepard is a 76 y.o. female.  HPI     Home Medications Prior to Admission medications   Medication Sig Start Date End Date Taking? Authorizing Provider  acetaminophen (TYLENOL) 325 MG tablet Take 650 mg by mouth every 6 (six) hours as needed.    [provider]  amoxicillin (AMOXIL) 500 MG capsule Take 2 capsules (1,000 mg total) by mouth 2 (two) times daily. 03/12/22   Copland, Gay Filler, MD  benazepril-hydrochlorthiazide (LOTENSIN HCT) 20-12.5 MG tablet TAKE 2 TABLETS BY MOUTH EVERY DAY 10/03/21   Copland, Gay Filler, MD  clotrimazole (MYCELEX) 10 MG troche Take 1 tablet (10 mg total) by mouth 5 (five) times daily. Use for 7 days 03/26/22   Copland, Gay Filler, MD  glucosamine-chondroitin 500-400 MG tablet Take 1 tablet by mouth daily.    [provider]  Multiple Vitamin (MULTI-VITAMINS) TABS Take by mouth.    [provider]  naproxen (NAPROSYN) 250 MG tablet Take by mouth as needed.     [provider]  Omega-3 Fatty Acids (FISH OIL) 1000 MG CAPS 1 TAB DAILY 04/06/13   Josue Hector, MD  OVER THE COUNTER MEDICATION OTC Calcium and Magnesium taking 2 tabs daily    [provider]      Allergies    Hydrocodone-acetaminophen, Influenza vaccines, Motrin [ibuprofen], Nabumetone, Percocet [oxycodone-acetaminophen], and Vicodin [hydrocodone-acetaminophen]    Review of Systems   Review of Systems  Physical Exam Updated Vital Signs BP 123/60   Pulse 81   Temp 98.8 F (37.1 C) (Oral)   Resp 17   SpO2 96%  Physical Exam  ED Results / Procedures / Treatments   Labs (all labs ordered are listed, but only abnormal results are displayed) Labs Reviewed - No data to display  EKG None  Radiology No results  found.  Procedures Procedures  {Document cardiac monitor, telemetry assessment procedure when appropriate:1}  Medications Ordered in ED Medications  EPINEPHrine (EPI-PEN) injection 0.3 mg (0.3 mg Intramuscular Given 04/22/22 0938)  diphenhydrAMINE (BENADRYL) injection 50 mg (50 mg Intravenous Given 04/22/22 0938)  methylPREDNISolone sodium succinate (SOLU-MEDROL) 125 mg/2 mL injection 125 mg (125 mg Intravenous Given 04/22/22 0939)  famotidine (PEPCID) IVPB 20 mg premix (0 mg Intravenous Stopped 04/22/22 1038)    ED Course/ Medical Decision Making/ A&P                           Medical Decision Making Risk Prescription drug management.   ***  {Document critical care time when appropriate:1} {Document review of labs and clinical decision tools ie heart score, Chads2Vasc2 etc:1}  {Document your independent review of radiology images, and any outside records:1} {Document your discussion with family members, caretakers, and with consultants:1} {Document social determinants of health affecting pt's care:1} {Document your decision making why or why not admission, treatments were needed:1} Final Clinical Impression(s) / ED Diagnoses Final diagnoses:  None    Rx / DC Orders ED Discharge Orders     None

## 2022-04-22 NOTE — ED Notes (Signed)
Patient arrives for an allergic reaction. Patient airway patent at this time, no stridor, no respiratory distress noted. Patient able to complete sentences during triage.

## 2022-04-24 ENCOUNTER — Ambulatory Visit: Payer: Federal, State, Local not specified - PPO | Admitting: Internal Medicine

## 2022-04-29 ENCOUNTER — Encounter: Payer: Self-pay | Admitting: Family Medicine

## 2022-04-30 ENCOUNTER — Ambulatory Visit (INDEPENDENT_AMBULATORY_CARE_PROVIDER_SITE_OTHER): Payer: Medicare Other | Admitting: Family Medicine

## 2022-04-30 VITALS — BP 122/60 | HR 70 | Temp 98.5°F | Resp 18 | Ht 62.0 in | Wt 165.2 lb

## 2022-04-30 DIAGNOSIS — Z91030 Bee allergy status: Secondary | ICD-10-CM | POA: Diagnosis not present

## 2022-04-30 DIAGNOSIS — B078 Other viral warts: Secondary | ICD-10-CM

## 2022-05-03 ENCOUNTER — Ambulatory Visit: Payer: Federal, State, Local not specified - PPO | Admitting: Family Medicine

## 2022-05-04 ENCOUNTER — Encounter: Payer: Self-pay | Admitting: Family Medicine

## 2022-05-04 ENCOUNTER — Other Ambulatory Visit: Payer: Self-pay | Admitting: Family Medicine

## 2022-05-04 MED ORDER — CLOTRIMAZOLE 10 MG MT TROC: 10.0000 mg | Freq: Every day | OROMUCOSAL | 0 refills | Status: DC

## 2022-05-04 NOTE — Telephone Encounter (Signed)
Pt recently treated for anaphylactic shock to bee sting a copuple of weeks ago, was treated for Thrush before that on 03/25/22 with Mycelex Troche.   Please advise.

## 2022-05-13 ENCOUNTER — Encounter (HOSPITAL_BASED_OUTPATIENT_CLINIC_OR_DEPARTMENT_OTHER): Payer: Self-pay | Admitting: Orthopaedic Surgery

## 2022-05-24 ENCOUNTER — Ambulatory Visit (HOSPITAL_BASED_OUTPATIENT_CLINIC_OR_DEPARTMENT_OTHER): Payer: Self-pay | Admitting: Orthopaedic Surgery

## 2022-05-24 ENCOUNTER — Ambulatory Visit (INDEPENDENT_AMBULATORY_CARE_PROVIDER_SITE_OTHER): Payer: Medicare Other | Admitting: Orthopaedic Surgery

## 2022-05-24 ENCOUNTER — Ambulatory Visit (HOSPITAL_BASED_OUTPATIENT_CLINIC_OR_DEPARTMENT_OTHER): Payer: Medicare Other | Admitting: Orthopaedic Surgery

## 2022-05-24 DIAGNOSIS — M23006 Cystic meniscus, unspecified meniscus, right knee: Secondary | ICD-10-CM

## 2022-05-24 MED ORDER — ACETAMINOPHEN 500 MG PO TABS: 500.0000 mg | ORAL_TABLET | Freq: Three times a day (TID) | ORAL | 0 refills | Status: AC

## 2022-05-24 MED ORDER — ASPIRIN 325 MG PO TBEC: 325.0000 mg | DELAYED_RELEASE_TABLET | Freq: Every day | ORAL | 0 refills | Status: DC
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
## 2022-05-27 ENCOUNTER — Encounter (HOSPITAL_BASED_OUTPATIENT_CLINIC_OR_DEPARTMENT_OTHER): Payer: Self-pay | Admitting: Orthopaedic Surgery

## 2022-05-28 ENCOUNTER — Other Ambulatory Visit (HOSPITAL_BASED_OUTPATIENT_CLINIC_OR_DEPARTMENT_OTHER): Payer: Self-pay | Admitting: Orthopaedic Surgery

## 2022-05-28 ENCOUNTER — Encounter: Payer: Self-pay | Admitting: Family Medicine

## 2022-05-28 ENCOUNTER — Encounter: Payer: Self-pay | Admitting: Internal Medicine

## 2022-05-28 ENCOUNTER — Encounter (HOSPITAL_BASED_OUTPATIENT_CLINIC_OR_DEPARTMENT_OTHER): Payer: Self-pay | Admitting: Orthopaedic Surgery

## 2022-05-28 ENCOUNTER — Ambulatory Visit (INDEPENDENT_AMBULATORY_CARE_PROVIDER_SITE_OTHER): Payer: Medicare Other | Admitting: Internal Medicine

## 2022-05-28 VITALS — BP 130/56 | HR 80 | Temp 97.7°F | Resp 20 | Ht 62.0 in | Wt 164.3 lb

## 2022-05-28 DIAGNOSIS — R053 Chronic cough: Secondary | ICD-10-CM | POA: Diagnosis not present

## 2022-05-28 DIAGNOSIS — K219 Gastro-esophageal reflux disease without esophagitis: Secondary | ICD-10-CM | POA: Diagnosis not present

## 2022-05-28 DIAGNOSIS — J310 Chronic rhinitis: Secondary | ICD-10-CM | POA: Diagnosis not present

## 2022-05-28 DIAGNOSIS — T782XXA Anaphylactic shock, unspecified, initial encounter: Secondary | ICD-10-CM | POA: Diagnosis not present

## 2022-05-28 DIAGNOSIS — B999 Unspecified infectious disease: Secondary | ICD-10-CM | POA: Diagnosis not present

## 2022-05-28 MED ORDER — HYDROMORPHONE HCL 2 MG PO TABS: 2.0000 mg | ORAL_TABLET | ORAL | 0 refills | Status: DC | PRN

## 2022-05-28 NOTE — Patient Instructions (Signed)
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 -Your risk of having another systemic or life threatening allergy with subsequent stings is 60-80%  - We will get blood work to confirm allergy  -Venom Immunotherapy will reduce your risk with subsequent stings to less than the general population (<3%)   -You will be protected once you reach your maintenance dose of VIT   -Build up can be done via RUSH protocol or STANDARD protocol, the difference is the need for pre-medications with RUSH, but you will get protections much more quickly   Recurrent Sinus Infections/chronic cough  - There likely is multiple causes to include uncontrolled chronic rhinitis and reflux  - We can hold off on treating reflux for now and focus on rhinitis first  - Breathing test done for chronic cough showed normal lung function, I do not think you have asthma  - Testing today showed negative to all aeroallergens  - Start sinus rinses twice a day to help reduce drainage  - We will obtain some screening labs to evaluate your immune system.  - Labs to evaluate the quantitative [facility]) aspects of your immune system: IgG/IgA/IgM, CBC with differential - Labs to evaluate the qualitative ([place]) aspects of your immune system: CH50, Pneumococcal titers, Tetanus titers, Diphtheria titers - We may consider immunizations with Pneumovax and Tdap to challenge your immune system, and then obtain repeat titers in 4-6 weeks.    Follow up: we will call you with your labs results and go from there   Thank you so much for letting me partake in your care today.  Don't hesitate to reach out if you have any additional concerns!  [name], MD  [facility] [place], [place]
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
## 2022-05-29 DIAGNOSIS — B999 Unspecified infectious disease: Secondary | ICD-10-CM | POA: Diagnosis not present

## 2022-05-29 DIAGNOSIS — T782XXA Anaphylactic shock, unspecified, initial encounter: Secondary | ICD-10-CM | POA: Diagnosis not present
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
## 2022-05-31 ENCOUNTER — Ambulatory Visit (INDEPENDENT_AMBULATORY_CARE_PROVIDER_SITE_OTHER): Payer: Medicare Other | Admitting: Family Medicine

## 2022-05-31 VITALS — BP 124/72 | HR 82 | Temp 97.9°F | Resp 18 | Ht 62.0 in | Wt 165.8 lb

## 2022-05-31 DIAGNOSIS — Z01818 Encounter for other preprocedural examination: Secondary | ICD-10-CM | POA: Diagnosis not present

## 2022-05-31 NOTE — Patient Instructions (Addendum)
It was good to see you today- I will send in your clearance form for you  Best of luck with your surgery

## 2022-06-01 ENCOUNTER — Encounter: Payer: Self-pay | Admitting: Orthopaedic Surgery

## 2022-06-02 LAB — ALLERGEN HYMENOPTERA PANEL
Bumblebee: <0.1 kU/L
Honeybee IgE: <0.1 kU/L
Hornet, White Face, IgE: 0.61 kU/L — AB
Hornet, Yellow, IgE: 1.85 kU/L — AB
Paper Wasp IgE: 10.5 kU/L — AB
Yellow Jacket, IgE: 10.5 kU/L — AB

## 2022-06-04 LAB — CBC WITH DIFFERENTIAL/PLATELET
Basophils Absolute: 0.1 10*3/uL (ref 0.0–0.2)
Basos: 1 %
EOS (ABSOLUTE): 0.3 10*3/uL (ref 0.0–0.4)
Eos: 3 %
Hematocrit: 40.1 % (ref 34.0–46.6)
Hemoglobin: 13.7 g/dL (ref 11.1–15.9)
Immature Grans (Abs): 0 10*3/uL (ref 0.0–0.1)
Immature Granulocytes: 0 %
Lymphocytes Absolute: 1.7 10*3/uL (ref 0.7–3.1)
Lymphs: 20 %
MCH: 30.7 pg (ref 26.6–33.0)
MCHC: 34.2 g/dL (ref 31.5–35.7)
MCV: 90 fL (ref 79–97)
Monocytes Absolute: 0.6 10*3/uL (ref 0.1–0.9)
Monocytes: 7 %
Neutrophils Absolute: 5.7 10*3/uL (ref 1.4–7.0)
Neutrophils: 69 %
Platelets: 598 10*3/uL — ABNORMAL HIGH (ref 150–450)
RBC: 4.46 x10E6/uL (ref 3.77–5.28)
RDW: 13.3 % (ref 11.7–15.4)
WBC: 8.4 10*3/uL (ref 3.4–10.8)

## 2022-06-04 LAB — STREP PNEUMONIAE 23 SEROTYPES IGG
Pneumo Ab Type 1*: 0.9 ug/mL — ABNORMAL LOW (ref >1.3)
Pneumo Ab Type 12 (12F)*: <0.1 ug/mL — ABNORMAL LOW (ref >1.3)
Pneumo Ab Type 14*: 0.4 ug/mL — ABNORMAL LOW (ref >1.3)
Pneumo Ab Type 17 (17F)*: <0.1 ug/mL — ABNORMAL LOW (ref >1.3)
Pneumo Ab Type 19 (19F)*: 10.9 ug/mL (ref >1.3)
Pneumo Ab Type 2*: 0.6 ug/mL — ABNORMAL LOW (ref >1.3)
Pneumo Ab Type 20*: 2.6 ug/mL (ref >1.3)
Pneumo Ab Type 22 (22F)*: <0.1 ug/mL — ABNORMAL LOW (ref >1.3)
Pneumo Ab Type 23 (23F)*: <0.1 ug/mL — ABNORMAL LOW (ref >1.3)
Pneumo Ab Type 26 (6B)*: 0.6 ug/mL — ABNORMAL LOW (ref >1.3)
Pneumo Ab Type 3*: 0.2 ug/mL — ABNORMAL LOW (ref >1.3)
Pneumo Ab Type 34 (10A)*: 1.2 ug/mL — ABNORMAL LOW (ref >1.3)
Pneumo Ab Type 4*: 0.8 ug/mL — ABNORMAL LOW (ref >1.3)
Pneumo Ab Type 43 (11A)*: <0.1 ug/mL — ABNORMAL LOW (ref >1.3)
Pneumo Ab Type 5*: <0.1 ug/mL — ABNORMAL LOW (ref >1.3)
Pneumo Ab Type 51 (7F)*: 1.5 ug/mL (ref >1.3)
Pneumo Ab Type 54 (15B)*: <0.1 ug/mL — ABNORMAL LOW (ref >1.3)
Pneumo Ab Type 56 (18C)*: 0.4 ug/mL — ABNORMAL LOW (ref >1.3)
Pneumo Ab Type 57 (19A)*: 5 ug/mL (ref >1.3)
Pneumo Ab Type 68 (9V)*: 0.3 ug/mL — ABNORMAL LOW (ref >1.3)
Pneumo Ab Type 70 (33F)*: >10.1 ug/mL (ref >1.3)
Pneumo Ab Type 8*: 0.3 ug/mL — ABNORMAL LOW (ref >1.3)
Pneumo Ab Type 9 (9N)*: 1.9 ug/mL (ref >1.3)

## 2022-06-04 LAB — IMMUNOGLOBULINS A/E/G/M, SERUM
IgA/Immunoglobulin A, Serum: 51 mg/dL — ABNORMAL LOW (ref 64–422)
IgE (Immunoglobulin E), Serum: 1333 IU/mL — ABNORMAL HIGH (ref 6–495)
IgG (Immunoglobin G), Serum: 637 mg/dL (ref 586–1602)
IgM (Immunoglobulin M), Srm: 35 mg/dL (ref 26–217)

## 2022-06-04 LAB — DIPHTHERIA / TETANUS ANTIBODY PANEL
Diphtheria Ab: 0.85 IU/mL (ref <0.10)
Tetanus Ab, IgG: 2.02 IU/mL (ref <0.10)

## 2022-06-04 LAB — COMPLEMENT, TOTAL: Compl, Total (CH50): >60 U/mL (ref >41)

## 2022-06-05 NOTE — Progress Notes (Signed)
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  I recommend venom immunotherapy preferably with RUSH protocol, but she can also do standard if seh wants.   In regards to her immune work up her vaccine titers to strep pneumoniae returned low.  Otherwise her lab work up is normal.  We need to have her receive her pneumovax vaccine (PSV23) at her primary care office and recheck titers in 4-6 weeks. For further work up.  Can someone call patient and let her know?  Thanks!

## 2022-06-06 ENCOUNTER — Encounter (HOSPITAL_BASED_OUTPATIENT_CLINIC_OR_DEPARTMENT_OTHER): Payer: Self-pay | Admitting: Orthopaedic Surgery

## 2022-06-11 DIAGNOSIS — H2513 Age-related nuclear cataract, bilateral: Secondary | ICD-10-CM | POA: Diagnosis not present

## 2022-06-12 ENCOUNTER — Encounter (HOSPITAL_BASED_OUTPATIENT_CLINIC_OR_DEPARTMENT_OTHER): Payer: Self-pay | Admitting: Orthopaedic Surgery

## 2022-06-12 ENCOUNTER — Other Ambulatory Visit: Payer: Self-pay

## 2022-06-13 ENCOUNTER — Encounter (HOSPITAL_BASED_OUTPATIENT_CLINIC_OR_DEPARTMENT_OTHER)
Admission: RE | Admit: 2022-06-13 | Discharge: 2022-06-13 | Disposition: A | Discharge status: Home / Self Care | Payer: Medicare Other | Source: Ambulatory Visit | Attending: Orthopaedic Surgery | Admitting: Orthopaedic Surgery

## 2022-06-13 DIAGNOSIS — Z01812 Encounter for preprocedural laboratory examination: Secondary | ICD-10-CM | POA: Insufficient documentation

## 2022-06-13 LAB — BASIC METABOLIC PANEL
Anion gap: 9 (ref 5–15)
BUN: 13 mg/dL (ref 8–23)
CO2: 29 mmol/L (ref 22–32)
Calcium: 9.4 mg/dL (ref 8.9–10.3)
Chloride: 101 mmol/L (ref 98–111)
Creatinine, Ser: 0.83 mg/dL (ref 0.44–1.00)
GFR, Estimated: >60 mL/min (ref >60)
Glucose, Bld: 105 mg/dL — ABNORMAL HIGH (ref 70–99)
Potassium: 4.2 mmol/L (ref 3.5–5.1)
Sodium: 139 mmol/L (ref 135–145)

## 2022-06-13 NOTE — Progress Notes (Signed)

## 2022-06-13 NOTE — Progress Notes (Signed)
Can we schedule a telehelath visit with me so I can explain more and answer her questions about the immune work up? Thanks!

## 2022-06-14 ENCOUNTER — Encounter (HOSPITAL_BASED_OUTPATIENT_CLINIC_OR_DEPARTMENT_OTHER): Payer: Self-pay | Admitting: Orthopaedic Surgery

## 2022-06-14 ENCOUNTER — Other Ambulatory Visit (HOSPITAL_BASED_OUTPATIENT_CLINIC_OR_DEPARTMENT_OTHER): Payer: Self-pay | Admitting: Orthopaedic Surgery

## 2022-06-14 MED ORDER — TRAMADOL HCL 50 MG PO TABS: 50.0000 mg | ORAL_TABLET | Freq: Four times a day (QID) | ORAL | 0 refills | Status: DC | PRN

## 2022-06-19 ENCOUNTER — Ambulatory Visit (HOSPITAL_BASED_OUTPATIENT_CLINIC_OR_DEPARTMENT_OTHER): Payer: Medicare Other | Admitting: Anesthesiology

## 2022-06-19 ENCOUNTER — Encounter (HOSPITAL_BASED_OUTPATIENT_CLINIC_OR_DEPARTMENT_OTHER): Payer: Self-pay | Admitting: Orthopaedic Surgery

## 2022-06-19 ENCOUNTER — Encounter (HOSPITAL_BASED_OUTPATIENT_CLINIC_OR_DEPARTMENT_OTHER)
Admission: RE | Disposition: A | Discharge status: Home / Self Care | Payer: Self-pay | Source: Home / Self Care | Attending: Orthopaedic Surgery

## 2022-06-19 ENCOUNTER — Ambulatory Visit (HOSPITAL_BASED_OUTPATIENT_CLINIC_OR_DEPARTMENT_OTHER)
Admission: RE | Admit: 2022-06-19 | Discharge: 2022-06-19 | Disposition: A | Discharge status: Home / Self Care | Payer: Medicare Other | Attending: Orthopaedic Surgery | Admitting: Orthopaedic Surgery

## 2022-06-19 DIAGNOSIS — Z79899 Other long term (current) drug therapy: Secondary | ICD-10-CM | POA: Insufficient documentation

## 2022-06-19 DIAGNOSIS — Z6829 Body mass index (BMI) 29.0-29.9, adult: Secondary | ICD-10-CM | POA: Diagnosis not present

## 2022-06-19 DIAGNOSIS — R7303 Prediabetes: Secondary | ICD-10-CM | POA: Insufficient documentation

## 2022-06-19 DIAGNOSIS — M8580 Other specified disorders of bone density and structure, unspecified site: Secondary | ICD-10-CM | POA: Diagnosis not present

## 2022-06-19 DIAGNOSIS — Z791 Long term (current) use of non-steroidal anti-inflammatories (NSAID): Secondary | ICD-10-CM | POA: Diagnosis not present

## 2022-06-19 DIAGNOSIS — M1990 Unspecified osteoarthritis, unspecified site: Secondary | ICD-10-CM | POA: Diagnosis not present

## 2022-06-19 DIAGNOSIS — E669 Obesity, unspecified: Secondary | ICD-10-CM | POA: Diagnosis not present

## 2022-06-19 DIAGNOSIS — I10 Essential (primary) hypertension: Secondary | ICD-10-CM | POA: Diagnosis not present

## 2022-06-19 DIAGNOSIS — S83241A Other tear of medial meniscus, current injury, right knee, initial encounter: Secondary | ICD-10-CM | POA: Diagnosis not present

## 2022-06-19 DIAGNOSIS — X58XXXA Exposure to other specified factors, initial encounter: Secondary | ICD-10-CM | POA: Diagnosis not present

## 2022-06-19 DIAGNOSIS — M23006 Cystic meniscus, unspecified meniscus, right knee: Secondary | ICD-10-CM | POA: Diagnosis not present

## 2022-06-19 DIAGNOSIS — M23003 Cystic meniscus, unspecified medial meniscus, right knee: Secondary | ICD-10-CM | POA: Diagnosis not present

## 2022-06-19 HISTORY — PX: KNEE ARTHROSCOPY WITH MEDIAL MENISECTOMY: SHX5651

## 2022-06-19 SURGERY — ARTHROSCOPY, KNEE, WITH MEDIAL MENISCECTOMY
Anesthesia: General | Site: Knee | Laterality: Right

## 2022-06-19 MED ORDER — ACETAMINOPHEN 500 MG PO TABS
ORAL_TABLET | ORAL | Status: AC
  Filled 2022-06-19: qty 2

## 2022-06-19 MED ORDER — ACETAMINOPHEN 500 MG PO TABS
1000.0000 mg | ORAL_TABLET | Freq: Once | ORAL | Status: AC
  Administered 2022-06-19: 1000 mg via ORAL

## 2022-06-19 MED ORDER — PROPOFOL 10 MG/ML IV BOLUS
INTRAVENOUS | Status: DC | PRN
  Administered 2022-06-19: 110 mg via INTRAVENOUS

## 2022-06-19 MED ORDER — BUPIVACAINE HCL (PF) 0.25 % IJ SOLN
INTRAMUSCULAR | Status: DC | PRN
  Administered 2022-06-19: 35 mL

## 2022-06-19 MED ORDER — LACTATED RINGERS IV SOLN: INTRAVENOUS | Status: DC

## 2022-06-19 MED ORDER — MIDAZOLAM HCL 2 MG/2ML IJ SOLN
INTRAMUSCULAR | Status: AC
  Filled 2022-06-19: qty 2

## 2022-06-19 MED ORDER — FENTANYL CITRATE (PF) 100 MCG/2ML IJ SOLN: 25.0000 ug | INTRAMUSCULAR | Status: DC | PRN

## 2022-06-19 MED ORDER — CEFAZOLIN SODIUM-DEXTROSE 2-4 GM/100ML-% IV SOLN
INTRAVENOUS | Status: AC
  Filled 2022-06-19: qty 100

## 2022-06-19 MED ORDER — GABAPENTIN 300 MG PO CAPS
ORAL_CAPSULE | ORAL | Status: AC
  Filled 2022-06-19: qty 1

## 2022-06-19 MED ORDER — TRANEXAMIC ACID-NACL 1000-0.7 MG/100ML-% IV SOLN
INTRAVENOUS | Status: AC
  Filled 2022-06-19: qty 100

## 2022-06-19 MED ORDER — TRAMADOL HCL 50 MG PO TABS
50.0000 mg | ORAL_TABLET | Freq: Once | ORAL | Status: AC
  Administered 2022-06-19: 50 mg via ORAL

## 2022-06-19 MED ORDER — ONDANSETRON HCL 4 MG/2ML IJ SOLN
INTRAMUSCULAR | Status: DC | PRN
  Administered 2022-06-19: 4 mg via INTRAVENOUS

## 2022-06-19 MED ORDER — GABAPENTIN 300 MG PO CAPS
300.0000 mg | ORAL_CAPSULE | Freq: Once | ORAL | Status: AC
  Administered 2022-06-19: 300 mg via ORAL

## 2022-06-19 MED ORDER — FENTANYL CITRATE (PF) 100 MCG/2ML IJ SOLN
INTRAMUSCULAR | Status: DC | PRN
  Administered 2022-06-19: 50 ug via INTRAVENOUS

## 2022-06-19 MED ORDER — CEFAZOLIN SODIUM-DEXTROSE 2-4 GM/100ML-% IV SOLN
2.0000 g | INTRAVENOUS | Status: AC
  Administered 2022-06-19: 2 g via INTRAVENOUS

## 2022-06-19 MED ORDER — ONDANSETRON HCL 4 MG/2ML IJ SOLN
INTRAMUSCULAR | Status: AC
  Filled 2022-06-19: qty 2

## 2022-06-19 MED ORDER — SODIUM CHLORIDE 0.9 % IR SOLN: Status: DC | PRN

## 2022-06-19 MED ORDER — DEXMEDETOMIDINE (PRECEDEX) IN NS 20 MCG/5ML (4 MCG/ML) IV SYRINGE
PREFILLED_SYRINGE | INTRAVENOUS | Status: DC | PRN
  Administered 2022-06-19: 4 ug via INTRAVENOUS
  Administered 2022-06-19 (×2): 8 ug via INTRAVENOUS

## 2022-06-19 MED ORDER — TRANEXAMIC ACID-NACL 1000-0.7 MG/100ML-% IV SOLN
1000.0000 mg | INTRAVENOUS | Status: AC
  Administered 2022-06-19: 1000 mg via INTRAVENOUS

## 2022-06-19 MED ORDER — DEXAMETHASONE SODIUM PHOSPHATE 10 MG/ML IJ SOLN
INTRAMUSCULAR | Status: AC
  Filled 2022-06-19: qty 1

## 2022-06-19 MED ORDER — DEXMEDETOMIDINE HCL IN NACL 80 MCG/20ML IV SOLN
INTRAVENOUS | Status: AC
  Filled 2022-06-19: qty 20

## 2022-06-19 MED ORDER — TRAMADOL HCL 50 MG PO TABS
ORAL_TABLET | ORAL | Status: AC
  Filled 2022-06-19: qty 1

## 2022-06-19 MED ORDER — PROPOFOL 500 MG/50ML IV EMUL
INTRAVENOUS | Status: AC
  Filled 2022-06-19: qty 50

## 2022-06-19 MED ORDER — PROPOFOL 500 MG/50ML IV EMUL
INTRAVENOUS | Status: DC | PRN
  Administered 2022-06-19: 30 ug/kg/min via INTRAVENOUS

## 2022-06-19 MED ORDER — PROPOFOL 10 MG/ML IV BOLUS
INTRAVENOUS | Status: AC
  Filled 2022-06-19: qty 20

## 2022-06-19 MED ORDER — DEXAMETHASONE SODIUM PHOSPHATE 10 MG/ML IJ SOLN
INTRAMUSCULAR | Status: DC | PRN
  Administered 2022-06-19: 5 mg via INTRAVENOUS

## 2022-06-19 MED ORDER — LIDOCAINE HCL 1 % IJ SOLN
INTRAMUSCULAR | Status: DC | PRN
  Administered 2022-06-19: 50 mg via INTRADERMAL

## 2022-06-19 MED ORDER — EPINEPHRINE PF 1 MG/ML IJ SOLN
INTRAMUSCULAR | Status: AC
  Filled 2022-06-19: qty 4

## 2022-06-19 MED ORDER — MIDAZOLAM HCL 5 MG/5ML IJ SOLN
INTRAMUSCULAR | Status: DC | PRN
  Administered 2022-06-19 (×2): 1 mg via INTRAVENOUS

## 2022-06-19 SURGICAL SUPPLY — 50 items
APL PRP STRL LF DISP 70% ISPRP (MISCELLANEOUS) ×1
BANDAGE ESMARK 6X9 LF (GAUZE/BANDAGES/DRESSINGS) IMPLANT
BLADE EXCALIBUR 4.0X13 (MISCELLANEOUS) IMPLANT
BNDG CMPR 9X6 STRL LF SNTH (GAUZE/BANDAGES/DRESSINGS)
BNDG ELASTIC 4X5.8 VLCR STR LF (GAUZE/BANDAGES/DRESSINGS) IMPLANT
BNDG ELASTIC 6X5.8 VLCR STR LF (GAUZE/BANDAGES/DRESSINGS) ×3 IMPLANT
BNDG ESMARK 6X9 LF (GAUZE/BANDAGES/DRESSINGS)
CHLORAPREP W/TINT 26 (MISCELLANEOUS) ×3 IMPLANT
COOLER ICEMAN CLASSIC (MISCELLANEOUS) IMPLANT
CUFF TOURN SGL QUICK 34 (TOURNIQUET CUFF) ×2
CUFF TRNQT CYL 34X4.125X (TOURNIQUET CUFF) ×2 IMPLANT
DISSECTOR  3.8MM X 13CM (MISCELLANEOUS) ×2
DISSECTOR 3.8MM X 13CM (MISCELLANEOUS) ×2 IMPLANT
DRAPE ARTHROSCOPY W/POUCH 90 (DRAPES) ×3 IMPLANT
DRAPE IMP U-DRAPE 54X76 (DRAPES) ×3 IMPLANT
DRAPE INCISE IOBAN 66X45 STRL (DRAPES) IMPLANT
DRAPE U-SHAPE 47X51 STRL (DRAPES) IMPLANT
DRSG PAD ABDOMINAL 8X10 ST (GAUZE/BANDAGES/DRESSINGS) IMPLANT
DW OUTFLOW CASSETTE/TUBE SET (MISCELLANEOUS) IMPLANT
ELECT REM PT RETURN 9FT ADLT (ELECTROSURGICAL)
ELECTRODE REM PT RTRN 9FT ADLT (ELECTROSURGICAL) IMPLANT
EXCALIBUR 3.8MM X 13CM (MISCELLANEOUS) IMPLANT
GAUZE 4X4 16PLY ~~LOC~~+RFID DBL (SPONGE) IMPLANT
GAUZE SPONGE 4X4 12PLY STRL (GAUZE/BANDAGES/DRESSINGS) ×2 IMPLANT
GAUZE XEROFORM 1X8 LF (GAUZE/BANDAGES/DRESSINGS) ×2 IMPLANT
GLOVE BIO SURGEON STRL SZ 6 (GLOVE) ×3 IMPLANT
GLOVE BIO SURGEON STRL SZ7.5 (GLOVE) ×3 IMPLANT
GLOVE BIOGEL PI IND STRL 6.5 (GLOVE) ×2 IMPLANT
GLOVE BIOGEL PI IND STRL 8 (GLOVE) ×2 IMPLANT
GLOVE BIOGEL PI INDICATOR 6.5 (GLOVE) ×2
GLOVE BIOGEL PI INDICATOR 8 (GLOVE) ×2
GOWN STRL REUS W/ TWL LRG LVL3 (GOWN DISPOSABLE) ×2 IMPLANT
GOWN STRL REUS W/ TWL XL LVL3 (GOWN DISPOSABLE) ×1 IMPLANT
GOWN STRL REUS W/TWL LRG LVL3 (GOWN DISPOSABLE) ×2
GOWN STRL REUS W/TWL XL LVL3 (GOWN DISPOSABLE) ×2
MANIFOLD NEPTUNE II (INSTRUMENTS) ×3 IMPLANT
NDL HYPO 18GX1.5 BLUNT FILL (NEEDLE) ×2 IMPLANT
NDL SAFETY ECLIPSE 18X1.5 (NEEDLE) ×2 IMPLANT
NEEDLE HYPO 18GX1.5 BLUNT FILL (NEEDLE) ×2 IMPLANT
NEEDLE HYPO 18GX1.5 SHARP (NEEDLE) ×2
PACK ARTHROSCOPY DSU (CUSTOM PROCEDURE TRAY) ×3 IMPLANT
PACK BASIN DAY SURGERY FS (CUSTOM PROCEDURE TRAY) ×2 IMPLANT
PAD COLD SHLDR WRAP-ON (PAD) ×3 IMPLANT
PADDING CAST COTTON 6X4 STRL (CAST SUPPLIES) ×3 IMPLANT
PORT APPOLLO RF 90DEGREE MULTI (SURGICAL WAND) ×3 IMPLANT
SLEEVE SCD COMPRESS KNEE MED (STOCKING) ×3 IMPLANT
SUT ETHILON 3 0 PS 1 (SUTURE) ×3 IMPLANT
SYR 5ML LL (SYRINGE) ×3 IMPLANT
TOWEL GREEN STERILE FF (TOWEL DISPOSABLE) ×4 IMPLANT
TUBING ARTHROSCOPY IRRIG 16FT (MISCELLANEOUS) ×3 IMPLANT

## 2022-06-19 NOTE — Anesthesia Preprocedure Evaluation (Addendum)
Anesthesia Evaluation  Patient identified by MRN, date of birth, ID band Patient awake    Reviewed: Allergy & Precautions, NPO status , Patient's Chart, lab work & pertinent test results  Airway Mallampati: II  TM Distance: >3 FB Neck ROM: Full    Dental no notable dental hx. (+) Teeth Intact, Dental Advisory Given   Pulmonary neg pulmonary ROS,    Pulmonary exam normal breath sounds clear to auscultation       Cardiovascular hypertension, Pt. on medications Normal cardiovascular exam Rhythm:Regular Rate:Normal     Neuro/Psych Anxiety Nuclear sclerosis    GI/Hepatic negative GI ROS, Neg liver ROS,   Endo/Other  Obesity Hyperlipidemia Pre diabetes  Renal/GU negative Renal ROS  negative genitourinary   Musculoskeletal  (+) Arthritis , Osteoarthritis,  Osteopenia   Abdominal   Peds  Hematology negative hematology ROS (+)   Anesthesia Other Findings   Reproductive/Obstetrics                            Anesthesia Physical Anesthesia Plan  ASA: 2  Anesthesia Plan: General   Post-op Pain Management: Minimal or no pain anticipated   Induction: Intravenous  PONV Risk Score and Plan: 4 or greater and Treatment may vary due to age or medical condition, Ondansetron, Propofol infusion and TIVA  Airway Management Planned: LMA  Additional Equipment: None  Intra-op Plan:   Post-operative Plan: Extubation in OR  Informed Consent: I have reviewed the patients History and Physical, chart, labs and discussed the procedure including the risks, benefits and alternatives for the proposed anesthesia with the patient or authorized representative who has indicated his/her understanding and acceptance.     Dental advisory given  Plan Discussed with: CRNA and Anesthesiologist  Anesthesia Plan Comments:        Anesthesia Quick Evaluation

## 2022-06-19 NOTE — Discharge Instructions (Addendum)
No tylenol until 4:p.m.      Discharge Instructions    Attending Surgeon: [name], MD Office Phone Number: [phone]   Diagnosis and Procedures:    Surgeries Performed: Left knee arthroscopic debridement   Discharge Plan:    Diet: Resume usual diet. Begin with light or bland foods.  Drink plenty of fluids.  Activity:  Activity and weight as tolerated on the left leg. You are advised to go home directly from the hospital or surgical center. Restrict your activities.  GENERAL INSTRUCTIONS: 1.  Please apply ice to your wound to help with swelling and inflammation. This will improve your comfort and your overall recovery following surgery.     2. Please call Dr. Eddie Dibbles office at [phone] with questions Monday-Friday during business hours. If no one answers, please leave a message and someone should get back to the patient within 24 hours. For emergencies please call 911 or proceed to the emergency room.   3. Patient to notify surgical team if experiences any of the following: Bowel/Bladder dysfunction, uncontrolled pain, nerve/muscle weakness, incision with increased drainage or redness, nausea/vomiting and Fever greater than 101.0 F.  Be alert for signs of infection including redness, streaking, odor, fever or chills. Be alert for excessive pain or bleeding and notify your surgeon immediately.  WOUND INSTRUCTIONS:   Leave your dressing, cast, or splint in place until your post operative visit.  Keep it clean and dry.  Always keep the incision clean and dry until the staples/sutures are removed. If there is no drainage from the incision you should keep it open to air. If there is drainage from the incision you must keep it covered at all times until the drainage stops  Do not soak in a bath tub, hot tub, pool, lake or other body of water until 21 days after your surgery and your incision is completely dry and healed.  If you have removable sutures (or staples)  they must be removed 10-14 days (unless otherwise instructed) from the day of your surgery.     1)  Elevate the extremity as much as possible.  2)  Keep the dressing clean and dry.  3)  Please call us if the dressing becomes wet or dirty.  4)  If you are experiencing worsening pain or worsening swelling, please call.     MEDICATIONS: Resume all previous home medications at the previous prescribed dose and frequency unless otherwise noted Start taking the  pain medications on an as-needed basis as prescribed  Please taper down pain medication over the next week following surgery.  Ideally you should not require a refill of any narcotic pain medication.  Take pain medication with food to minimize nausea. In addition to the prescribed pain medication, you may take over-the-counter pain relievers such as Tylenol.  Do NOT take additional tylenol if your pain medication already has tylenol in it.  Aspirin '325mg'$  daily for four weeks.      FOLLOWUP INSTRUCTIONS: 1. Follow up at the Physical Therapy Clinic 3-4 days following surgery. This appointment should be scheduled unless other arrangements have been made.The Physical Therapy scheduling number is [phone] if an appointment has not already been arranged.  2. Contact Dr. Eddie Dibbles office during office hours at [phone] or the practice after hours line at [phone] for non-emergencies. For medical emergencies call 911.   Discharge Location: Home   Post Anesthesia Home Care Instructions  Activity: Get plenty of rest for the remainder of the day. A responsible individual  must stay with you for 24 hours following the procedure.  For the next 24 hours, DO NOT: -Drive a car -Paediatric nurse -Drink alcoholic beverages -Take any medication unless instructed by your physician -Make any legal decisions or sign important papers.  Meals: Start with liquid foods such as gelatin or soup. Progress to regular foods as tolerated. Avoid  greasy, spicy, heavy foods. If nausea and/or vomiting occur, drink only clear liquids until the nausea and/or vomiting subsides. Call your physician if vomiting continues.  Special Instructions/Symptoms: Your throat may feel dry or sore from the anesthesia or the breathing tube placed in your throat during surgery. If this causes discomfort, gargle with warm salt water. The discomfort should disappear within 24 hours.  If you had a scopolamine patch placed behind your ear for the management of post- operative nausea and/or vomiting:  1. The medication in the patch is effective for 72 hours, after which it should be removed.  Wrap patch in a tissue and discard in the trash. Wash hands thoroughly with soap and water. 2. You may remove the patch earlier than 72 hours if you experience unpleasant side effects which may include dry mouth, dizziness or visual disturbances. 3. Avoid touching the patch. Wash your hands with soap and water after contact with the patch.

## 2022-06-19 NOTE — Anesthesia Postprocedure Evaluation (Signed)
Anesthesia Post Note  Patient: Lindsey Shepard  Procedure(s) Performed: RIGHT KNEE ARTHROSCOPY WITH MEDIAL MENISECTOMY (Right: Knee)     Patient location during evaluation: PACU Anesthesia Type: General Level of consciousness: awake and alert and oriented Pain management: pain level controlled Vital Signs Assessment: post-procedure vital signs reviewed and stable Respiratory status: spontaneous breathing, nonlabored ventilation and respiratory function stable Cardiovascular status: blood pressure returned to baseline and stable Postop Assessment: no apparent nausea or vomiting Anesthetic complications: no   No notable events documented.  Last Vitals:  Vitals:   06/19/22 1445 06/19/22 1500  BP: (!) 146/64 (!) 153/65  Pulse: (!) 56 (!) 53  Resp: 15 13  Temp:    SpO2: 95% 95%    Last Pain:  Vitals:   06/19/22 1500  TempSrc:   PainSc: 5                  Jaelan Rasheed A.

## 2022-06-19 NOTE — Brief Op Note (Signed)
   Brief Op Note  Date of Surgery: 06/19/2022  Preoperative Diagnosis: RIGHT KNEE MEDIAL MENISCAL CYST  Postoperative Diagnosis: same  Procedure: Procedure(s): RIGHT KNEE ARTHROSCOPY WITH MEDIAL MENISECTOMY  Implants: * No implants in log *  Surgeons: Surgeon(s): [name], MD  Anesthesia: General    Estimated Blood Loss: See anesthesia record  Complications: None  Condition to PACU: Stable  Yevonne Pax, MD 06/19/2022 2:02 PM

## 2022-06-19 NOTE — Anesthesia Procedure Notes (Signed)
Procedure Name: LMA Insertion Date/Time: 06/19/2022 1:10 PM  Performed by: Garrel Ridgel, CRNAPre-anesthesia Checklist: Patient identified, Emergency Drugs available, Suction available and Patient being monitored Patient Re-evaluated:Patient Re-evaluated prior to induction Oxygen Delivery Method: Circle system utilized Preoxygenation: Pre-oxygenation with 100% oxygen Induction Type: IV induction Ventilation: Mask ventilation without difficulty LMA: LMA inserted LMA Size: 4.0 Tube type: Oral Number of attempts: 1 Placement Confirmation: positive ETCO2 and breath sounds checked- equal and bilateral Tube secured with: Tape Dental Injury: Teeth and Oropharynx as per pre-operative assessment

## 2022-06-19 NOTE — Interval H&P Note (Signed)
History and Physical Interval Note:  06/19/2022 12:39 PM  Lindsey Shepard  has presented today for surgery, with the diagnosis of RIGHT KNEE MEDIAL MENISCAL CYST.  The various methods of treatment have been discussed with the patient and family. After consideration of risks, benefits and other options for treatment, the patient has consented to  Procedure(s): RIGHT KNEE ARTHROSCOPY WITH MEDIAL MENISECTOMY (Right) as a surgical intervention.  The patient's history has been reviewed, patient examined, no change in status, stable for surgery.  I have reviewed the patient's chart and labs.  Questions were answered to the patient's satisfaction.     [name]

## 2022-06-19 NOTE — Op Note (Signed)
Date of Surgery: 06/19/2022  INDICATIONS: Ms. Blixt is a 76 y.o.-year-old female with meniscal cyst that has failed conservative measures she is ultimately elected for knee arthroscopy.  The risk and benefits of the procedure were discussed in detail and documented in the pre-operative evaluation.   PREOPERATIVE DIAGNOSIS: 1.  Right knee medial meniscal cyst with possible meniscal tear  POSTOPERATIVE DIAGNOSIS: Same.  PROCEDURE: 1.  Right knee medial meniscal debridement with cyst removal  SURGEON: Yevonne Pax MD  ASSISTANT: Raynelle Fanning, ATC  ANESTHESIA:  general  IV FLUIDS AND URINE: See anesthesia record.  ANTIBIOTICS: Ancef   ESTIMATED BLOOD LOSS: 5 mL.  IMPLANTS:  * No implants in log *  DRAINS: None  CULTURES: None  COMPLICATIONS: none  DESCRIPTION OF PROCEDURE:  Examination under anesthesia: A careful examination under anesthesia was performed.  Knee ROM motion was: -2-135 Lachman: Normal Pivot Shift: Normal Posterior drawer: normal.   Varus stability in full extension: normal.   Varus stability in 30 degrees of flexion: normal.  Valgus stability in full extension: normal.   Valgus stability in 30 degrees of flexion: normal.  Posterolateral drawer: normal   Intra-operative findings: A thorough arthroscopic examination of the knee was performed.  The findings are: 1. Suprapatellar pouch: Normal 2. Undersurface of median ridge: Normal 3. Medial patellar facet: Normal 4. Lateral patellar facet: Normal 5. Trochlea: Normal 6. Lateral gutter/popliteus tendon: Normal 7. Hoffa's fat pad: Normal 8. Medial gutter/plica: Normal 9. ACL: Normal 10. PCL: Normal 11. Medial meniscus: There was a anterior meniscal cyst surrounding an anterior meniscal tear with intact anterior 12. Medial compartment cartilage: Grade 3 changes involving the femoral condyle.  Grade 4 focal changes on the tibia around the meniscal tear 13. Lateral meniscus: Normal 14. Lateral  compartment cartilage: Normal   I identified the patient in the pre-operative holding area.  I marked the operative knee with my initials. I reviewed the risks and benefits of the proposed surgical intervention and the patient wished to proceed.  Anesthesia performed a peripheral nerve block.  Patient was subsequently taken back to the operating room.  The patient was transferred to the operative suite and placed in the supine position with all bony prominences padded.     SCDs were placed on the non-operative lower extremity. Appropriate antibiotics was administered within 1 hour before incision. The operative lower extremity was then prepped and draped in standard fashion. A time out was performed confirming the correct extremity, correct patient and correct procedure.    A standard anterolateral portal was made with an 11 blade.  The ideal position for the anteromedial portal was established using a spinal needle.  This AM portal was then created under direct visualization with an 11 blade.  A full diagnostic arthroscopy was then performed, as described above, including probing of the chondral and meniscal surfaces.     At this time there was noted to be a anterior meniscal cyst next to an anterior meniscal tear.  This was debrided with a combination of a shaver and [employer] electrocautery.  There was fraying involving the white white portion of the anterior portion of the meniscus.  The root was probed and found to be stable.  A shaver and biter to the right was introduced and this was taken back to healthy meniscus margin with 50% of the meniscus remaining.  The root was again probed and found to remain stable.  Arthroscopic fluid was evacuated.  Wounds were closed with 3-0 nylon.  Soft dressing  with Xeroform gauze Webril and Ace was applied.  All counts were correct at the end of the case.  She is taken to the PACU without complication.    POSTOPERATIVE PLAN: She will be weightbearing as  tolerated on the right leg without activity restriction.  She will begin activity as soon as tolerated.  I will plan to see her back in 2 weeks for suture removal.  Yevonne Pax, MD 2:09 PM
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
## 2022-06-19 NOTE — Transfer of Care (Signed)
Immediate Anesthesia Transfer of Care Note  Patient: Lindsey Shepard  Procedure(s) Performed: RIGHT KNEE ARTHROSCOPY WITH MEDIAL MENISECTOMY (Right: Knee)  Patient Location: PACU  Anesthesia Type:General  Level of Consciousness: oriented, drowsy and patient cooperative  Airway & Oxygen Therapy: Patient Spontanous Breathing and Patient connected to face mask oxygen  Post-op Assessment: Report given to RN and Post -op Vital signs reviewed and stable  Post vital signs: Reviewed and stable  Last Vitals:  Vitals Value Taken Time  BP 120/54 06/19/22 1407  Temp    Pulse 58 06/19/22 1413  Resp 13 06/19/22 1413  SpO2 98 % 06/19/22 1413  Vitals shown include unvalidated device data.  Last Pain:  Vitals:   06/19/22 0954  TempSrc: Oral  PainSc: 2          Complications: No notable events documented.

## 2022-06-20 ENCOUNTER — Encounter (HOSPITAL_BASED_OUTPATIENT_CLINIC_OR_DEPARTMENT_OTHER): Payer: Self-pay | Admitting: Orthopaedic Surgery

## 2022-06-21 ENCOUNTER — Encounter (HOSPITAL_BASED_OUTPATIENT_CLINIC_OR_DEPARTMENT_OTHER): Payer: Self-pay | Admitting: Orthopaedic Surgery

## 2022-06-22 ENCOUNTER — Encounter (HOSPITAL_BASED_OUTPATIENT_CLINIC_OR_DEPARTMENT_OTHER): Payer: Self-pay | Admitting: Orthopaedic Surgery

## 2022-07-04 ENCOUNTER — Ambulatory Visit (INDEPENDENT_AMBULATORY_CARE_PROVIDER_SITE_OTHER): Payer: Federal, State, Local not specified - PPO | Admitting: Orthopaedic Surgery

## 2022-07-04 DIAGNOSIS — M23006 Cystic meniscus, unspecified meniscus, right knee: Secondary | ICD-10-CM

## 2022-07-04 NOTE — Progress Notes (Signed)
Post Operative Evaluation    Procedure/Date of Surgery: Right knee medial meniscectomy 06/19/22   Interval History:    Lindsey Shepard presents today 2 weeks status post right knee medial meniscal debridement.  She is having some swelling and soreness in the front of the knee and is frustrated with this.  She is experiencing pain on the lateral aspect of the knee as well.  There is been some bruising as well.  She has been compliant with medication usage.  She has been progressively putting weight on the knee.  This does feel stiff and swollen.  She has been recently beginning exercises.   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   Past Surgical History:  Procedure Laterality Date   ABDOMINAL HYSTERECTOMY  1997   per Dr Margaretha Glassing    BREAST BIOPSY     fracture left arm     with hardware   KNEE ARTHROSCOPY WITH MEDIAL MENISECTOMY Right 06/19/2022   Procedure: RIGHT KNEE ARTHROSCOPY WITH MEDIAL MENISECTOMY;  Surgeon: [name], MD;  Location: [facility];  Service: Orthopedics;  Laterality: Right;   left arm surgery  2009   reflex sympathetic dystrophy- 9 surgeries   NEPHRECTOMY  1993   right   Social History   Socioeconomic History   Marital status: Widowed    Spouse name: Not on file   Number of children: 1   Years of education: BS   Highest education level: Not on file  Occupational History   Occupation: Retired  Tobacco Use   Smoking status: Never   Smokeless tobacco: Never  Vaping Use   Vaping Use: Never used  Substance and Sexual Activity   Alcohol use: Yes    Alcohol/week: 4.0 standard drinks of alcohol    Types: 4 Glasses of wine per week    Comment: 1 glass/week   Drug use: No    Sexual activity: Not Currently  Other Topics Concern   Not on file  Social History Narrative   Right handed    Coffee every morning   Lives alone   Social Determinants of Health   Financial Resource Strain: Low Risk  (09/25/2021)   Overall Financial Resource Strain (CARDIA)    Difficulty of Paying Living Expenses: Not hard at all  Food Insecurity: No Food Insecurity (09/25/2021)   Hunger Vital Sign    Worried About Running Out of Food in the Last Year: Never true    Ran Out of Food in the Last Year: Never true  Transportation Needs: No Transportation Needs (09/25/2021)   PRAPARE - Hydrologist (Medical): No    Lack of Transportation (Non-Medical): No  Physical Activity: Sufficiently Active (09/25/2021)   Exercise Vital Sign    Days of Exercise per Week: 7 days    Minutes of Exercise per Session: 30 min  Stress: No Stress Concern Present (09/25/2021)   Ray    Feeling of Stress : Not at all  Social Connections: Socially Isolated (09/25/2021)  Social Licensed conveyancer [NHANES]    Frequency of Communication with Friends and Family: More than three times a week    Frequency of Social Gatherings with Friends and Family: More than three times a week    Attends Religious Services: Never    Marine scientist or Organizations: No    Attends Archivist Meetings: Never    Marital Status: Widowed   Family History  Problem Relation Age of Onset   Heart disease Mother    Diabetes Father    Pancreatic cancer Father    Heart disease Brother    Allergies  Allergen Reactions   Hydrocodone-Acetaminophen Nausea Only   Influenza Vaccines     Pt had localized redness following high dose flu vaccine    Motrin [Ibuprofen] Swelling   Nabumetone Swelling    swelling   Percocet [Oxycodone-Acetaminophen] Nausea And Vomiting   Vicodin [Hydrocodone-Acetaminophen]  Nausea Only   Current Outpatient Medications  Medication Sig Dispense Refill   acetaminophen (TYLENOL) 325 MG tablet Take 650 mg by mouth every 6 (six) hours as needed.     aspirin EC 325 MG tablet Take 1 tablet (325 mg total) by mouth daily. 14 tablet 0   benazepril-hydrochlorthiazide (LOTENSIN HCT) 20-12.5 MG tablet TAKE 2 TABLETS BY MOUTH EVERY DAY 180 tablet 3   clotrimazole (MYCELEX) 10 MG troche Take 1 tablet (10 mg total) by mouth 5 (five) times daily. Use for 7 days 35 Troche 0   EPINEPHrine 0.3 mg/0.3 mL IJ SOAJ injection Inject 0.3 mg into the muscle as needed for anaphylaxis. 1 each 0   glucosamine-chondroitin 500-400 MG tablet Take 1 tablet by mouth daily.     HYDROmorphone (DILAUDID) 2 MG tablet Take 1 tablet (2 mg total) by mouth every 4 (four) hours as needed for severe pain (breakthrough). 20 tablet 0   Multiple Vitamin (MULTI-VITAMINS) TABS Take by mouth.     naproxen (NAPROSYN) 250 MG tablet Take by mouth as needed.      Omega-3 Fatty Acids (FISH OIL) 1000 MG CAPS 1 TAB DAILY     OVER THE COUNTER MEDICATION OTC Calcium and Magnesium taking 2 tabs daily     traMADol (ULTRAM) 50 MG tablet Take 1 tablet (50 mg total) by mouth every 6 (six) hours as needed. 30 tablet 0   No current facility-administered medications for this visit.   No results found.  Review of Systems:   A ROS was performed including pertinent positives and negatives as documented in the HPI.   Musculoskeletal Exam:    There were no vitals taken for this visit.  Right knee incisions are well-appearing with no erythema or drainage.  There is tenderness about the medial lateral parapatellar area.  There is bruising in this area.  Range of motion is from 0 to 120 degrees with a trace effusion.  Distal neurosensory exam is intact 2+ dorsalis pedis pulse  Imaging:      I personally reviewed and interpreted the radiographs.   Assessment:   2 weeks status post right knee medial meniscal debridement  overall with pain and irritation around the fat pad.  I did discuss that as part of the meniscal cyst debridement there was an debridement of the fat pad which is likely causing her swelling and pain.  This time I do believe that she would benefit from skilled physical therapy in order to work on strengthening and to help with her overall range of motion and swelling.  I will plan  to see her back in 4 weeks for reassessment  Plan :    -Return to clinic in 4 weeks for reassessment      I personally saw and evaluated the patient, and participated in the management and treatment plan.  [name], MD Attending Physician, Orthopedic Surgery  This document was dictated using Dragon voice recognition software. A reasonable attempt at proof reading has been made to minimize errors.

## 2022-07-06 ENCOUNTER — Encounter: Payer: Self-pay | Admitting: Internal Medicine
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
## 2022-07-10 ENCOUNTER — Ambulatory Visit (HOSPITAL_BASED_OUTPATIENT_CLINIC_OR_DEPARTMENT_OTHER): Payer: Medicare Other | Attending: Orthopaedic Surgery | Admitting: Physical Therapy

## 2022-07-10 ENCOUNTER — Encounter (HOSPITAL_BASED_OUTPATIENT_CLINIC_OR_DEPARTMENT_OTHER): Payer: Self-pay | Admitting: Physical Therapy

## 2022-07-10 ENCOUNTER — Other Ambulatory Visit: Payer: Self-pay

## 2022-07-10 ENCOUNTER — Encounter (HOSPITAL_BASED_OUTPATIENT_CLINIC_OR_DEPARTMENT_OTHER): Payer: Self-pay | Admitting: Orthopaedic Surgery

## 2022-07-10 DIAGNOSIS — R262 Difficulty in walking, not elsewhere classified: Secondary | ICD-10-CM | POA: Insufficient documentation

## 2022-07-10 DIAGNOSIS — M6281 Muscle weakness (generalized): Secondary | ICD-10-CM | POA: Insufficient documentation

## 2022-07-10 DIAGNOSIS — M23006 Cystic meniscus, unspecified meniscus, right knee: Secondary | ICD-10-CM | POA: Diagnosis not present

## 2022-07-10 DIAGNOSIS — M25661 Stiffness of right knee, not elsewhere classified: Secondary | ICD-10-CM | POA: Insufficient documentation

## 2022-07-10 DIAGNOSIS — M25561 Pain in right knee: Secondary | ICD-10-CM | POA: Diagnosis not present

## 2022-07-18 ENCOUNTER — Encounter (HOSPITAL_BASED_OUTPATIENT_CLINIC_OR_DEPARTMENT_OTHER): Payer: Medicare Other | Admitting: Physical Therapy

## 2022-07-24 ENCOUNTER — Encounter (HOSPITAL_BASED_OUTPATIENT_CLINIC_OR_DEPARTMENT_OTHER): Payer: Self-pay | Admitting: Orthopaedic Surgery

## 2022-07-24 ENCOUNTER — Ambulatory Visit (INDEPENDENT_AMBULATORY_CARE_PROVIDER_SITE_OTHER): Payer: Medicare Other | Admitting: Internal Medicine

## 2022-07-24 ENCOUNTER — Encounter: Payer: Self-pay | Admitting: Internal Medicine

## 2022-07-24 VITALS — BP 120/60 | HR 65 | Temp 97.7°F | Resp 20

## 2022-07-24 DIAGNOSIS — B999 Unspecified infectious disease: Secondary | ICD-10-CM | POA: Diagnosis not present

## 2022-07-24 DIAGNOSIS — L659 Nonscarring hair loss, unspecified: Secondary | ICD-10-CM

## 2022-07-24 DIAGNOSIS — L299 Pruritus, unspecified: Secondary | ICD-10-CM | POA: Diagnosis not present

## 2022-07-24 DIAGNOSIS — R053 Chronic cough: Secondary | ICD-10-CM

## 2022-07-24 DIAGNOSIS — T63481D Toxic effect of venom of other arthropod, accidental (unintentional), subsequent encounter: Secondary | ICD-10-CM

## 2022-07-24 DIAGNOSIS — K219 Gastro-esophageal reflux disease without esophagitis: Secondary | ICD-10-CM | POA: Diagnosis not present

## 2022-07-24 NOTE — Patient Instructions (Addendum)
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 -Continue avoidance measures  -Venom Immunotherapy is an option for treatment of this allergy if you are interested   -You will be protected once you reach your maintenance dose of VIT   -Build up can be done via RUSH protocol or STANDARD protocol, the difference is the need for pre-medications with RUSH, but you will get protections much more quickly   Recurrent Sinus Infections/chronic cough  - Labs 05/28/22: Strep pneumonia titers only positive for 6/23, immunoglobulins, diphtheria tetanus titers, complement and CBC were all normal - 05/28/22: Allergy skin testing negative to all aeroallergens  -We do not have the Pneumovax vaccine in our office right now.  We can send a prescription to the pharmacy and have them administer at their or if you would like to wait a few weeks we can give it to you in a new [facility] office.  Let us know what you would like to do  -We will need to repeat your strep pneumonia titers in 6 weeks after vaccination.     Pruritus/Hair loss - We will refer you to dermatology for further evaluation   Follow up: we will call you with your labs results and go from there   Thank you so much for letting me partake in your care today.  Don't hesitate to reach out if you have any additional concerns!  [name], MD  [facility] [place], [place]  Gastroesophageal Reflux Induced Respiratory Disease and Laryngopharyngeal Reflux (LPR): Gastroesophageal reflux disease (GERD) is a condition where the contents of the stomach reflux or back up into the esophagus or swallowing tube.  This can result in a variety of clinical symptoms including classic symptoms and atypical symptoms.  Classic symptoms of GERD include: heartburn, chest pain, acid taste in the mouth, and difficulty in swallowing.  Atypical symptoms of  GERD include laryngopharyngeal reflux (LPR) and asthma.  LPR occurs when stomach reflux comes all the way up to the throat.  Clinical symptoms include hoarseness, raspy voice, laryngitis, throat clearing, postnasal drip, mucus stuck in the throat, a sensation of a lump in the throat, sore throat, and cough.  Most patients with LPR do not have classic symptoms of GERD.  Asthma can also be triggered by GERD.  The acid stomach fluid can stimulate nerve fibers in the esophagus which can cause an increase in bronchial muscle tone and narrowing of the airways.  Acid stomach contents may also reflux into the trachea and bronchi of the lungs where it can trigger an asthma attack.  Many people with GERD triggered asthma do not have classic symptoms of GERD.  Diagnosis of LPR and GERD induced asthma is frequently made from a typical history and response to medications.  It may take several months of medications to see a good response.  Occasionally, a 24-hour esophageal pH probe study must be performed.  Treatment of GERD/LPR includes:   Modification of diet and lifestyle Stop smoking Avoid overeating and lose weight Avoid acidic and fatty foods, chocolate, onions, garlic, peppermint Elevate the head of your bed 6 to 8 inches with blocks or wedge Medications Zantac, Pepcid, Axid, Tagamet Prilosec, Prevacid, Aciphex, Protonix, Nexium Surgery

## 2022-07-24 NOTE — Progress Notes (Signed)
Follow Up Note  RE: Lindsey Shepard MRN: [id number] DOB: 01-31-1946 Date of Office Visit: 07/24/2022  Referring provider: Darreld Mclean, MD Primary care provider: Darreld Mclean, MD  Chief Complaint: Follow-up (To discuss treatment options and concerns about immunodeficiency.)  History of Present Illness: I had the pleasure of seeing Lindsey Shepard for a follow up visit at the [facility] on 07/24/2022. She is a 76 y.o. female, who is being followed for hymenoptera allergy, recurrent sinus infections, chronic cough. Her previous allergy office visit was on 05/28/22 with Dr. Edison Pace. Today is a regular follow up visit.  History obtained from patient, chart review.  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  She would like avoid all pharmacologic options and is not interested in pneumovax vaccination for further evaluation of immunodeficiency.   GERD - She is doing some lifestyle and diet modifications.  She is not interested in medical therapy.    She is also reporting new complaint of diffuse pruritus associated with hair loss.  Prednisone improved symptoms (given during hospital visit for bee sting allergy).  She has not seen dermatology   Assessment and Plan: Lindsey Shepard is a 76 y.o. 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  Gastroesophageal reflux disease without esophagitis  Refractory chronic cough  Alopecia - Plan: Ambulatory referral to  Dermatology  Pruritus Plan: Patient Instructions  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 -Continue avoidance measures  -Venom Immunotherapy is an option for treatment of this allergy if you are interested   -You will be protected once you reach your maintenance dose of VIT   -Build up can be done via RUSH protocol or STANDARD protocol, the difference is the need for pre-medications with RUSH, but you will get protections much more quickly   Recurrent Sinus Infections/chronic cough  - Labs 05/28/22: Strep pneumonia titers only positive for 6/23, immunoglobulins, diphtheria tetanus titers, complement and CBC were all normal - 05/28/22: Allergy skin testing negative to all aeroallergens  -We do not have the Pneumovax vaccine in our office right now.  We can send a prescription to the pharmacy and have them administer at their or if you would like to wait a few weeks we can give it to you in a new [place] office.  Let us know what you would like to do  -We will need to repeat your strep pneumonia titers in 6 weeks after vaccination.     Pruritus/Hair loss - We will refer you to dermatology for further evaluation   Follow up: we will call you with your labs results and go from there   Thank you so much for letting me partake in your care today.  Don't hesitate to reach out if you have any additional concerns!  [name], MD  [facility] [place] [place]  Gastroesophageal Reflux Induced Respiratory Disease and Laryngopharyngeal Reflux (LPR): Gastroesophageal reflux disease (GERD) is a condition where the  contents of the stomach reflux or back up into the esophagus or swallowing tube.  This can result in a variety of clinical symptoms including classic symptoms and atypical symptoms.  Classic symptoms of GERD include: heartburn, chest pain, acid taste in the  mouth, and difficulty in swallowing.  Atypical symptoms of GERD include laryngopharyngeal reflux (LPR) and asthma.  LPR occurs when stomach reflux comes all the way up to the throat.  Clinical symptoms include hoarseness, raspy voice, laryngitis, throat clearing, postnasal drip, mucus stuck in the throat, a sensation of a lump in the throat, sore throat, and cough.  Most patients with LPR do not have classic symptoms of GERD.  Asthma can also be triggered by GERD.  The acid stomach fluid can stimulate nerve fibers in the esophagus which can cause an increase in bronchial muscle tone and narrowing of the airways.  Acid stomach contents may also reflux into the trachea and bronchi of the lungs where it can trigger an asthma attack.  Many people with GERD triggered asthma do not have classic symptoms of GERD.  Diagnosis of LPR and GERD induced asthma is frequently made from a typical history and response to medications.  It may take several months of medications to see a good response.  Occasionally, a 24-hour esophageal pH probe study must be performed.  Treatment of GERD/LPR includes:   Modification of diet and lifestyle Stop smoking Avoid overeating and lose weight Avoid acidic and fatty foods, chocolate, onions, garlic, peppermint Elevate the head of your bed 6 to 8 inches with blocks or wedge Medications Zantac, Pepcid, Axid, Tagamet Prilosec, Prevacid, Aciphex, Protonix, Nexium Surgery         No follow-ups on file.  No orders of the defined types were placed in this encounter.   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     aspirin EC 81 MG tablet Take 81 mg by mouth daily. Swallow whole.     benazepril-hydrochlorthiazide (LOTENSIN HCT) 20-12.5 MG tablet  TAKE 2 TABLETS BY MOUTH EVERY DAY 180 tablet 3   EPINEPHrine 0.3 mg/0.3 mL IJ SOAJ injection Inject 0.3 mg into the muscle as needed for anaphylaxis. 1 each 0   glucosamine-chondroitin 500-400 MG tablet Take 1 tablet by mouth daily.     Multiple Vitamin (MULTI-VITAMINS) TABS Take by mouth.     Omega-3 Fatty Acids (FISH OIL) 1000 MG CAPS 1 TAB DAILY     OVER THE COUNTER MEDICATION OTC Calcium and Magnesium taking 2 tabs daily     aspirin EC 325 MG tablet Take 1 tablet (325 mg total) by mouth daily. (Patient not taking: Reported on 07/10/2022) 14 tablet 0   No current facility-administered medications for this visit.   Allergies: Allergies  Allergen Reactions   Hydrocodone-Acetaminophen Nausea Only   Influenza Vaccines     Pt had localized redness following high dose flu vaccine    Mixed Vespid Venom     Yellow jacket,yellow hornet,white face hornet, paper wasp   Motrin [Ibuprofen] Swelling   Nabumetone Swelling    swelling   Percocet [Oxycodone-Acetaminophen] Nausea And Vomiting   Vicodin [Hydrocodone-Acetaminophen] Nausea Only   I reviewed her past medical history, social history, family history, and environmental history and no significant  changes have been reported from her previous visit.  ROS: All others negative except as noted per HPI.   Objective: BP 120/60 (BP Location: Left Arm, Patient Position: Sitting, Cuff Size: Normal)   Pulse 65   Temp 97.7 F (36.5 C) (Temporal)   Resp 20   SpO2 99%  There is no height or weight on file to calculate BMI. General Appearance:  Alert, cooperative, no distress, appears stated age  Head:  Normocephalic, without obvious abnormality, atraumatic  Eyes:  Conjunctiva clear, EOM's intact  Nose: Nares normal,   Throat: Lips, tongue normal; teeth and gums normal,   Neck: Supple, symmetrical  Lungs:   , Respirations unlabored, no coughing  Heart:  , Appears well perfused  Extremities: No edema  Skin: Skin color, texture, turgor normal, no  rashes or lesions on visualized portions of skin   Neurologic: No gross deficits   Previous notes and tests were reviewed. The plan was reviewed with the patient/family, and all questions/concerned were addressed.  It was my pleasure to see Lindsey Shepard today and participate in her care. Please feel free to contact me with any questions or concerns.  Sincerely,  [name], MD  Allergy & Immunology  [facility] Office: [phone]

## 2022-07-25 ENCOUNTER — Encounter: Payer: Self-pay | Admitting: Internal Medicine

## 2022-07-26 ENCOUNTER — Telehealth: Payer: Self-pay | Admitting: Internal Medicine

## 2022-07-26 ENCOUNTER — Encounter (HOSPITAL_BASED_OUTPATIENT_CLINIC_OR_DEPARTMENT_OTHER): Payer: Self-pay | Admitting: Physical Therapy

## 2022-07-26 ENCOUNTER — Ambulatory Visit (HOSPITAL_BASED_OUTPATIENT_CLINIC_OR_DEPARTMENT_OTHER): Payer: Medicare Other | Admitting: Physical Therapy

## 2022-07-26 DIAGNOSIS — M25561 Pain in right knee: Secondary | ICD-10-CM | POA: Diagnosis not present

## 2022-07-26 DIAGNOSIS — R262 Difficulty in walking, not elsewhere classified: Secondary | ICD-10-CM | POA: Diagnosis not present

## 2022-07-26 DIAGNOSIS — M23006 Cystic meniscus, unspecified meniscus, right knee: Secondary | ICD-10-CM | POA: Diagnosis not present

## 2022-07-26 DIAGNOSIS — M25661 Stiffness of right knee, not elsewhere classified: Secondary | ICD-10-CM

## 2022-07-26 DIAGNOSIS — M6281 Muscle weakness (generalized): Secondary | ICD-10-CM

## 2022-07-26 NOTE — Telephone Encounter (Signed)
Pt ask for a prescription for the pneumonia shot she said this was suggested by Dr. Edison Pace

## 2022-07-26 NOTE — Telephone Encounter (Signed)
Left a detailed message for patient to let her know we will call her tomorrow to confirm her pcp to see if they give the pneumovax 23 vaccine and if so we can call her pcp office and get her schedule to receive the vaccine. We will mail a lab requisition to her home for her to get her blood drawn 6 wks later from when she receives her pneumovax injection.
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
## 2022-07-27 ENCOUNTER — Encounter: Payer: Self-pay | Admitting: Internal Medicine

## 2022-07-29 ENCOUNTER — Encounter: Payer: Self-pay | Admitting: Internal Medicine

## 2022-07-30 ENCOUNTER — Telehealth: Payer: Self-pay

## 2022-07-30 ENCOUNTER — Encounter: Payer: Self-pay | Admitting: Family Medicine

## 2022-07-30 NOTE — Telephone Encounter (Signed)
Okay to schedule pt a NV for a prenvar20

## 2022-07-30 NOTE — Telephone Encounter (Signed)
Pt scheduled  

## 2022-07-30 NOTE — Telephone Encounter (Signed)
Pt informed to get the 23 instead of the 20

## 2022-07-30 NOTE — Telephone Encounter (Signed)
No she needs the 23 for immune evaluation.  It is a different type of vaccine than prevnar 20 and will be measuring a different part of her immune system

## 2022-07-30 NOTE — Telephone Encounter (Signed)
PT called and said she needed the penumo vax and wanted to know if the 20 was okay as her pharmacist said the 50 is the older version please advise

## 2022-07-30 NOTE — Telephone Encounter (Signed)
Please see other telephone contact.  

## 2022-07-30 NOTE — Telephone Encounter (Signed)
It is confusing with the new pneumonia vaccines.  But for the immunodeficiency work up you do need the preunomovax 23.  I believe Sharyn Lull had worked it out with Dr. Edilia Bo to give the correct vaccine at that office.  If that does not work out you can go to the [place] office later this week to get the vaccine.  I hope that clarifies things.

## 2022-07-31 ENCOUNTER — Ambulatory Visit (INDEPENDENT_AMBULATORY_CARE_PROVIDER_SITE_OTHER): Payer: Medicare Other

## 2022-07-31 DIAGNOSIS — Z23 Encounter for immunization: Secondary | ICD-10-CM | POA: Diagnosis not present

## 2022-07-31 NOTE — Progress Notes (Signed)
Lindsey Shepard is a 76 y.o. female presents to the office today for Pneumovax 23 injections, per physician's orders. Original order: 07/30/22 Pneumovax 23 IM was administered R Deltoid today. Patient tolerated injection. Patient due for follow up labs/provider appt: No.  DOD: Carmela Rima, Darlis Loan
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
## 2022-08-01 ENCOUNTER — Encounter (HOSPITAL_BASED_OUTPATIENT_CLINIC_OR_DEPARTMENT_OTHER): Payer: Self-pay | Admitting: Physical Therapy

## 2022-08-01 ENCOUNTER — Ambulatory Visit (HOSPITAL_BASED_OUTPATIENT_CLINIC_OR_DEPARTMENT_OTHER): Payer: Medicare Other | Admitting: Physical Therapy

## 2022-08-01 ENCOUNTER — Ambulatory Visit (INDEPENDENT_AMBULATORY_CARE_PROVIDER_SITE_OTHER): Payer: Medicare Other | Admitting: Orthopaedic Surgery

## 2022-08-01 ENCOUNTER — Encounter (HOSPITAL_BASED_OUTPATIENT_CLINIC_OR_DEPARTMENT_OTHER): Payer: Self-pay | Admitting: Orthopaedic Surgery

## 2022-08-01 DIAGNOSIS — M25561 Pain in right knee: Secondary | ICD-10-CM

## 2022-08-01 DIAGNOSIS — M6281 Muscle weakness (generalized): Secondary | ICD-10-CM

## 2022-08-01 DIAGNOSIS — M25661 Stiffness of right knee, not elsewhere classified: Secondary | ICD-10-CM | POA: Diagnosis not present

## 2022-08-01 DIAGNOSIS — M23006 Cystic meniscus, unspecified meniscus, right knee: Secondary | ICD-10-CM | POA: Diagnosis not present

## 2022-08-01 DIAGNOSIS — R262 Difficulty in walking, not elsewhere classified: Secondary | ICD-10-CM | POA: Diagnosis not present

## 2022-08-01 NOTE — Progress Notes (Signed)
Post Operative Evaluation    Procedure/Date of Surgery: Right knee medial meniscectomy 06/19/22   Interval History:   08/01/2022: Presents today for follow-up of her right knee.  Overall she definitely feels like she has turned a corner.  Her pain is much improved at this time.  She is very happy with the quality of her outcome.   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   Past Surgical History:  Procedure Laterality Date   ABDOMINAL HYSTERECTOMY  1997   per Dr Margaretha Glassing    BREAST BIOPSY     fracture left arm     with hardware   KNEE ARTHROSCOPY WITH MEDIAL MENISECTOMY Right 06/19/2022   Procedure: RIGHT KNEE ARTHROSCOPY WITH MEDIAL MENISECTOMY;  Surgeon: [name], MD;  Location: [place];  Service: Orthopedics;  Laterality: Right;   left arm surgery  2009   reflex sympathetic dystrophy- 9 surgeries   NEPHRECTOMY  1993   right   Social History   Socioeconomic History   Marital status: Widowed    Spouse name: Not on file   Number of children: 1   Years of education: BS   Highest education level: Not on file  Occupational History   Occupation: Retired  Tobacco Use   Smoking status: Never   Smokeless tobacco: Never  Vaping Use   Vaping Use: Never used  Substance and Sexual Activity   Alcohol use: Yes    Alcohol/week: 4.0 standard drinks of alcohol    Types: 4 Glasses of wine per week    Comment: 1 glass/week   Drug use: No   Sexual activity: Not Currently  Other Topics Concern   Not on file  Social History Narrative   Right handed    Coffee every morning   Lives alone   Social Determinants of Health   Financial Resource Strain: Low Risk  (09/25/2021)   Overall  Financial Resource Strain (CARDIA)    Difficulty of Paying Living Expenses: Not hard at all  Food Insecurity: No Food Insecurity (09/25/2021)   Hunger Vital Sign    Worried About Running Out of Food in the Last Year: Never true    Ran Out of Food in the Last Year: Never true  Transportation Needs: No Transportation Needs (09/25/2021)   PRAPARE - Hydrologist (Medical): No    Lack of Transportation (Non-Medical): No  Physical Activity: Sufficiently Active (09/25/2021)   Exercise Vital Sign    Days of Exercise per Week: 7 days    Minutes of Exercise per Session: 30 min  Stress: No Stress Concern Present (09/25/2021)   [place]    Feeling of Stress : Not at all  Social Connections: Socially Isolated (09/25/2021)   Social Connection and Isolation Panel [NHANES]    Frequency of Communication with Friends and Family: More than three times a week    Frequency of Social Gatherings with Friends and Family: More than three times a week    Attends Religious Services:  Never    Active Member of Clubs or Organizations: No    Attends Archivist Meetings: Never    Marital Status: Widowed   Family History  Problem Relation Age of Onset   Heart disease Mother    Diabetes Father    Pancreatic cancer Father    Heart disease Brother    Allergies  Allergen Reactions   Hydrocodone-Acetaminophen Nausea Only   Influenza Vaccines     Pt had localized redness following high dose flu vaccine    Mixed Vespid Venom     Yellow jacket,yellow hornet,white face hornet, paper wasp   Motrin [Ibuprofen] Swelling   Nabumetone Swelling    swelling   Percocet [Oxycodone-Acetaminophen] Nausea And Vomiting   Vicodin [Hydrocodone-Acetaminophen] Nausea Only   Current Outpatient Medications  Medication Sig Dispense Refill   acetaminophen (TYLENOL) 325 MG tablet Take 650 mg by mouth every 6 (six) hours as  needed.     aspirin EC 325 MG tablet Take 1 tablet (325 mg total) by mouth daily. (Patient not taking: Reported on 07/10/2022) 14 tablet 0   aspirin EC 81 MG tablet Take 81 mg by mouth daily. Swallow whole.     benazepril-hydrochlorthiazide (LOTENSIN HCT) 20-12.5 MG tablet TAKE 2 TABLETS BY MOUTH EVERY DAY 180 tablet 3   EPINEPHrine 0.3 mg/0.3 mL IJ SOAJ injection Inject 0.3 mg into the muscle as needed for anaphylaxis. 1 each 0   glucosamine-chondroitin 500-400 MG tablet Take 1 tablet by mouth daily.     Multiple Vitamin (MULTI-VITAMINS) TABS Take by mouth.     Omega-3 Fatty Acids (FISH OIL) 1000 MG CAPS 1 TAB DAILY     OVER THE COUNTER MEDICATION OTC Calcium and Magnesium taking 2 tabs daily     No current facility-administered medications for this visit.   No results found.  Review of Systems:   A ROS was performed including pertinent positives and negatives as documented in the HPI.   Musculoskeletal Exam:    There were no vitals taken for this visit.  Right knee incisions are healed.  No tenderness about the knee.  Bruising has resolved..  Range of motion is from 0 to 120 degrees with a trace effusion.  Distal neurosensory exam is intact 2+ dorsalis pedis pulse  Imaging:      I personally reviewed and interpreted the radiographs.   Assessment:   6 weeks status post right knee medial meniscal cyst debridement overall doing very well.  At this time she is essentially back to painless activity.  I will plan to see her back as needed  Plan :    -Return to clinic as needed      I personally saw and evaluated the patient, and participated in the management and treatment plan.  [name], MD Attending Physician, Orthopedic Surgery  This document was dictated using Dragon voice recognition software. A reasonable attempt at proof reading has been made to minimize errors.

## 2022-08-08 ENCOUNTER — Ambulatory Visit (HOSPITAL_BASED_OUTPATIENT_CLINIC_OR_DEPARTMENT_OTHER): Payer: Federal, State, Local not specified - PPO | Admitting: Physical Therapy

## 2022-09-13 ENCOUNTER — Other Ambulatory Visit: Payer: Self-pay | Admitting: Internal Medicine

## 2022-09-13 DIAGNOSIS — B999 Unspecified infectious disease: Secondary | ICD-10-CM | POA: Diagnosis not present

## 2022-09-19 LAB — STREP PNEUMONIAE 23 SEROTYPES IGG
Pneumo Ab Type 1*: >14.2 ug/mL (ref >1.3)
Pneumo Ab Type 12 (12F)*: 1 ug/mL — ABNORMAL LOW (ref >1.3)
Pneumo Ab Type 14*: 7 ug/mL (ref >1.3)
Pneumo Ab Type 17 (17F)*: 1 ug/mL — ABNORMAL LOW (ref >1.3)
Pneumo Ab Type 19 (19F)*: >35.9 ug/mL (ref >1.3)
Pneumo Ab Type 2*: 8.2 ug/mL (ref >1.3)
Pneumo Ab Type 20*: 20.7 ug/mL (ref >1.3)
Pneumo Ab Type 22 (22F)*: 4.7 ug/mL (ref >1.3)
Pneumo Ab Type 23 (23F)*: 1.5 ug/mL (ref >1.3)
Pneumo Ab Type 26 (6B)*: 6.7 ug/mL (ref >1.3)
Pneumo Ab Type 3*: 2.3 ug/mL (ref >1.3)
Pneumo Ab Type 34 (10A)*: >19.5 ug/mL (ref >1.3)
Pneumo Ab Type 4*: >8.3 ug/mL (ref >1.3)
Pneumo Ab Type 43 (11A)*: 1.6 ug/mL (ref >1.3)
Pneumo Ab Type 5*: 2.3 ug/mL (ref >1.3)
Pneumo Ab Type 51 (7F)*: >13.6 ug/mL (ref >1.3)
Pneumo Ab Type 54 (15B)*: 0.5 ug/mL — ABNORMAL LOW (ref >1.3)
Pneumo Ab Type 56 (18C)*: 5.4 ug/mL (ref >1.3)
Pneumo Ab Type 57 (19A)*: 17 ug/mL (ref >1.3)
Pneumo Ab Type 68 (9V)*: 5.5 ug/mL (ref >1.3)
Pneumo Ab Type 70 (33F)*: >10.1 ug/mL (ref >1.3)
Pneumo Ab Type 8*: 4.2 ug/mL (ref >1.3)
Pneumo Ab Type 9 (9N)*: >21.7 ug/mL (ref >1.3)

## 2022-09-24 ENCOUNTER — Encounter (HOSPITAL_BASED_OUTPATIENT_CLINIC_OR_DEPARTMENT_OTHER): Payer: Self-pay | Admitting: Orthopaedic Surgery

## 2022-09-24 NOTE — Progress Notes (Signed)
Post vaccination titers were normal.  This rules our specific antibody deficiency as a cause of recurrent infections.  Can someone let patient know?  Thanks!

## 2022-10-04 ENCOUNTER — Encounter: Payer: Self-pay | Admitting: Family Medicine

## 2022-10-07 ENCOUNTER — Encounter (HOSPITAL_BASED_OUTPATIENT_CLINIC_OR_DEPARTMENT_OTHER): Payer: Self-pay | Admitting: Orthopaedic Surgery

## 2022-10-09 ENCOUNTER — Telehealth (INDEPENDENT_AMBULATORY_CARE_PROVIDER_SITE_OTHER): Payer: Medicare Other | Admitting: Family Medicine

## 2022-10-09 VITALS — Ht 62.0 in | Wt 163.0 lb

## 2022-10-09 DIAGNOSIS — Z0000 Encounter for general adult medical examination without abnormal findings: Secondary | ICD-10-CM

## 2022-10-09 NOTE — Patient Instructions (Addendum)
I really enjoyed getting to talk with you today! I am available on Tuesdays and Thursdays for virtual visits if you have any questions or concerns, or if I can be of any further assistance.   CHECKLIST FROM ANNUAL WELLNESS VISIT:  -Follow up (please call to schedule if not scheduled after visit):  -Inperson visit with your Primary Doctor office: yearly with your doctor inperson and per your doctor's recommendations -yearly for annual wellness visit with primary care office  Here is a list of your preventive care/health maintenance measures and the plan for each if any are due:  Health Maintenance  Topic Date Due   Zoster Vaccines- Shingrix (1 of 2) Per patient wishes   COVID-19 Vaccine (3 - 2023-24 season) Per patient wishes   Medicare Annual Wellness (AWV)  10/10/2023   DTaP/Tdap/Td (2 - Tdap) 05/02/2027   Pneumonia Vaccine 71+ Years old  Completed   INFLUENZA VACCINE  Completed   DEXA SCAN  Done 01/3021, Due 01/3033 - can request with your primary care doctor.    Hepatitis C Screening  Completed   HPV VACCINES  Aged Out   COLONOSCOPY (Pts 45-4yr Insurance coverage will need to be confirmed)  Discontinued    -See a dentist at least yearly  -Get your eyes checked and then per your eye specialist's recommendations  -Other issues addressed today:  -I have included below further information regarding a healthy whole foods based diet, physical activity guidelines for adults, stress management and opportunities for social connections. I hope you find this information useful.     NUTRITION GUIDELINES: -eat real food: lots of colorful vegetables (half the plate) and fruits -5-7 servings of vegetables and fruits per day (fresh or steamed is  best), exp. 2 servings of vegetables with lunch and dinner and 2 servings of fruit per day. Berries and greens such as kale and collards are great choices.  -consume on a regular basis: whole grains (make sure first ingredient on label contains the word "whole"), fresh fruits, fish, nuts, seeds, healthy oils (such as olive oil, avocado oil, grape seed oil) -may eat small amounts of dairy and lean meat on occasion, but avoid processed meats such as ham, bacon, lunch meat, etc. -drink water -try to avoid fast food and pre-packaged foods, processed meat -most experts advise limiting sodium to < '2300mg'$  per day, should limit further is any chronic conditions such as high blood pressure, heart disease, diabetes, etc. The American Heart Association advised that < '1500mg'$  is is ideal -try to avoid foods that contain any ingredients with names you do not recognize  -try to avoid sugar/sweets (except for the natural sugar that occurs in fresh fruit) -try to avoid sweet drinks -try to avoid white rice, white bread, pasta (unless whole grain), white or yellow potatoes  EXERCISE GUIDELINES: -if you wish to increase your physical activity, do so gradually and with the approval of your doctor -STOP and seek medical care immediately if you have any chest pain, chest discomfort or trouble breathing when starting or increasing exercise  -move and stretch your body, legs, feet and arms when sitting for long periods -Physical activity guidelines for optimal health in adults: -least 150 minutes per week of aerobic exercise (can talk, but not sing) once approved by your doctor, 20-30 minutes of sustained activity or two 10 minute episodes of sustained activity every day.  -resistance training at least 2 days per week if approved by your doctor -balance exercises 3+ days per week:   Stand  somewhere where you have something sturdy to hold onto if you lose balance.    1) lift up on toes, start with 5x per day and work up  to 20x   2) stand and lift on leg straight out to the side so that foot is a few inches of the floor, start with 5x each side and work up to 20x each side   3) stand on one foot, start with 5 seconds each side and work up to 20 seconds on each side  If you need ideas or help with getting more active:  -Silver sneakers https://tools.silversneakers.com  -Walk with a Doc: http://stephens-thompson.biz/  -try to include resistance (weight lifting/strength building) and balance exercises twice per week: or the following link for ideas: ChessContest.fr  UpdateClothing.com.cy   SOCIAL CONNECTIONS: -options in [place] if you wish to engage in more social and exercise related activities:  -Silver sneakers https://tools.silversneakers.com  -Walk with a Doc: http://stephens-thompson.biz/  -Check out the [place] 50+ section on the [place] [employer] (hiking clubs, book clubs, cards and games, chess, exercise classes, aquatic classes and much more) - see the website for details: https://www.[place].gov/departments/parks-recreation/active-adults50  -YouTube has lots of exercise videos for different ages and abilities as well  -[place] (a variety of indoor and outdoor inperson activities for adults). [phone]. [address].  -Virtual Online Classes (a variety of topics): see seniorplanet.org or call 7871078501  -consider volunteering at a school, hospice center, church, senior center or elsewhere

## 2022-10-09 NOTE — Progress Notes (Signed)
PATIENT CHECK-IN and HEALTH RISK ASSESSMENT QUESTIONNAIRE:  -completed by phone/video for upcoming Medicare Preventive Visit  Pre-Visit Check-in: 1)Vitals (height, wt, BP, etc) - record in vitals section for visit on day of visit 2)Review and Update Medications, Allergies PMH, Surgeries, Social history in Epic 3)Hospitalizations in the last year with date/reason? No  4)Review and Update Care Team (patient's specialists) in Epic 5) Complete PHQ9 in Epic  6) Complete Fall Screening in Epic 7)Review all Health Maintenance Due and order under PCP if not done.  8)Medicare Wellness Questionnaire: Answer theses question about your habits: Do you drink alcohol? Yes If yes, how many drinks do you have a day?2-3 drinks a week Have you ever smoked?No Quit date if applicable? N/a  How many packs a day do/did you smoke? N/a Do you use smokeless tobacco?n/a Do you use an illicit drugs?n/a Do you exercises? Yes, IF so, what type and how many days/minutes per week?stretch, walking. 4 times of the week for 25 mins, lifting hand weights (has had PT and has incorporated some into her routine), does her own vacuuming and outdoor yard work (mowing, leaves, etc) Are you sexually active? No Number of partners?n/a Typical breakfast: english muffin with butter, scramble egg, black coffee.  Typical lunch: apple, orange, protein-tuna, [place] Typical dinner: chicken, vegetables, salad Typical snacks: cracker with peanut butter, cheese  Beverages: herbal tea, water,  Answer theses question about you: Can you perform most household chores?Yes Do you find it hard to follow a conversation in a noisy room?not sure Do you often ask people to speak up or repeat themselves? sometimes Do you feel that you have a problem with memory?no Do you balance your checkbook and or bank acounts?yes Do you feel safe at home?yes Last dentist visit? In July Do you need assistance with any of the following: Please note if so  No  Driving?  Feeding yourself?  Getting from bed to chair?  Getting to the toilet?  Bathing or showering?  Dressing yourself?  Managing money?  Climbing a flight of stairs  Preparing meals?  Do you have Advanced Directives in place (Living Will, Healthcare Power or Attorney)? yes   Last eye Exam and location?sometimes in July or September of 2023, [facility] with Dr. Glory Rosebush [place]   Do you currently use prescribed or non-prescribed narcotic or opioid pain medications?No  Do you have a history or close family history of breast, ovarian, tubal or peritoneal cancer or a family member with BRCA (breast cancer susceptibility 1 and 2) gene mutations?  Nurse/Assistant Credentials/time stamp: Karpuih M./CMA/10:41am  ----------------------------------------------------------------------------------------------------------------------------------------------------------------------------------------------------------------------   MEDICARE ANNUAL PREVENTIVE VISIT WITH PROVIDER: (Welcome to Medicare, initial annual wellness or annual wellness exam)  Virtual Visit via Video Note  I connected with Judythe  on 10/09/2022 by a video enabled telemedicine application and verified that I am speaking with the correct person using two identifiers.  Location patient: home Location provider:work or home office Persons participating in the virtual visit: patient, provider  Concerns and/or follow up today: dealing with some diverticulitis at times - sees GI for this.     See HM section in Epic for other details of completed HM.    ROS: negative for report of fevers, unintentional weight loss, vision changes, vision loss, hearing loss or change, chest pain, sob, hemoptysis, melena, hematochezia, hematuria, genital discharge or lesions, falls, bleeding or bruising, loc, thoughts of suicide or self harm, memory loss  Patient-completed extensive health risk assessment - reviewed and  discussed with the  patient: See Health Risk Assessment completed with patient prior to the visit either above or in recent phone note. This was reviewed in detailed with the patient today and appropriate recommendations, orders and referrals were placed as needed per Summary below and patient instructions.   Review of Medical History: -PMH, PSH, Family History and current specialty and care providers reviewed and updated and listed below   Patient Care Team: Copland, Gay Filler, MD as PCP - General (Family Medicine)   Past Medical History:  Diagnosis Date   Anxiety    Arthritis    Diverticulitis    Hyperlipidemia    Hypertension    Other abnormal clinical finding    mirco preforation with diverticulitis   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    Past Surgical History:  Procedure Laterality Date   ABDOMINAL HYSTERECTOMY  1997   per Dr Margaretha Glassing    BREAST BIOPSY     fracture left arm     with hardware   KNEE ARTHROSCOPY WITH MEDIAL MENISECTOMY Right 06/19/2022   Procedure: RIGHT KNEE ARTHROSCOPY WITH MEDIAL MENISECTOMY;  Surgeon: [name], MD;  Location: [facility];  Service: Orthopedics;  Laterality: Right;   left arm surgery  2009   reflex sympathetic dystrophy- 9 surgeries   NEPHRECTOMY  1993   right    Social History   Socioeconomic History   Marital status: Widowed    Spouse name: Not on file   Number of children: 1   Years of education: BS   Highest education level: Not on file  Occupational History   Occupation: Retired  Tobacco Use   Smoking status: Never   Smokeless tobacco: Never  Vaping Use   Vaping Use: Never used  Substance and Sexual Activity   Alcohol use: Yes    Alcohol/week: 4.0 standard drinks of alcohol    Types: 4 Glasses of wine per week    Comment: 1 glass/week   Drug use: No   Sexual activity: Not Currently  Other Topics Concern   Not on file  Social History Narrative   Right handed    Coffee every morning   Lives alone    Social Determinants of Health   Financial Resource Strain: Low Risk  (09/25/2021)   Overall Financial Resource Strain (CARDIA)    Difficulty of Paying Living Expenses: Not hard at all  Food Insecurity: No Food Insecurity (09/25/2021)   Hunger Vital Sign    Worried About Running Out of Food in the Last Year: Never true    Ran Out of Food in the Last Year: Never true  Transportation Needs: No Transportation Needs (09/25/2021)   PRAPARE - Hydrologist (Medical): No    Lack of Transportation (Non-Medical): No  Physical Activity: Sufficiently Active (09/25/2021)   Exercise Vital Sign    Days of Exercise per Week: 7 days    Minutes of Exercise per Session: 30 min  Stress: No Stress Concern Present (09/25/2021)   [place]    Feeling of Stress : Not at all  Social Connections: Socially Isolated (09/25/2021)   Social Connection and Isolation Panel [NHANES]    Frequency of Communication with Friends and Family: More than three times a week    Frequency of Social Gatherings with Friends and Family: More than three times a week    Attends Religious Services: Never    Marine scientist or Organizations: No  Attends Archivist Meetings: Never    Marital Status: Widowed  Intimate Partner Violence: Not At Risk (09/25/2021)   Humiliation, Afraid, Rape, and Kick questionnaire    Fear of Current or Ex-Partner: No    Emotionally Abused: No    Physically Abused: No    Sexually Abused: No    Family History  Problem Relation Age of Onset   Heart disease Mother    Diabetes Father    Pancreatic cancer Father    Heart disease Brother     Current Outpatient Medications on File Prior to Visit  Medication Sig Dispense Refill   acetaminophen (TYLENOL) 325 MG tablet Take 650 mg by mouth every 6 (six) hours as needed.     aspirin EC 81 MG tablet Take 81 mg by mouth daily. Swallow whole.      benazepril-hydrochlorthiazide (LOTENSIN HCT) 20-12.5 MG tablet TAKE 2 TABLETS BY MOUTH EVERY DAY 180 tablet 3   EPINEPHrine 0.3 mg/0.3 mL IJ SOAJ injection Inject 0.3 mg into the muscle as needed for anaphylaxis. 1 each 0   Multiple Vitamin (MULTI-VITAMINS) TABS Take by mouth.     Omega-3 Fatty Acids (FISH OIL) 1000 MG CAPS 1 TAB DAILY     OVER THE COUNTER MEDICATION OTC Calcium and Magnesium taking 2 tabs daily     glucosamine-chondroitin 500-400 MG tablet Take 1 tablet by mouth daily. (Patient not taking: Reported on 10/09/2022)     No current facility-administered medications on file prior to visit.    Allergies  Allergen Reactions   Hydrocodone-Acetaminophen Nausea Only   Influenza Vaccines     Pt had localized redness following high dose flu vaccine    Mixed Vespid Venom     Yellow jacket,yellow hornet,white face hornet, paper wasp   Motrin [Ibuprofen] Swelling   Nabumetone Swelling    swelling   Percocet [Oxycodone-Acetaminophen] Nausea And Vomiting   Vicodin [Hydrocodone-Acetaminophen] Nausea Only       Physical Exam There were no vitals filed for this visit. Estimated body mass index is 29.81 kg/m as calculated from the following:   Height as of this encounter: _0  (1.575 m).   Weight as of this encounter: 163 lb (73.9 kg).  EKG (optional): deferred due to virtual visit  GENERAL: alert, oriented, no audible sounds of distress  HEENT: vision exam deferred due to virtual visit  PSYCH/NEURO: pleasant and cooperative, no obvious depression or anxiety, speech and thought processing grossly intact, Cognitive function grossly intact        10/09/2022   10:27 AM 09/25/2021   11:17 AM 09/15/2020   10:18 AM 11/21/2017    9:42 AM 04/30/2016    2:06 PM  Depression screen PHQ 2/9  Decreased Interest 0 0 0 0 0  Down, Depressed, Hopeless 0 1 0 0 0  PHQ - 2 Score 0 1 0 0 0       09/15/2020   10:14 AM 06/13/2021   10:07 AM 09/25/2021   11:13 AM 04/22/2022    8:56  AM 10/09/2022   10:27 AM  Fall Risk  Falls in the past year? 0  0  0  Was there an injury with Fall? 0  0  0  Fall Risk Category Calculator 0  0  0  Fall Risk Category Low  Low  Low  Patient Fall Risk Level  Low fall risk Low fall risk Low fall risk Low fall risk  Patient at Risk for Falls Due to     No Fall  Risks  Fall risk Follow up   Falls prevention discussed  Falls evaluation completed     SUMMARY AND PLAN:  Medicare annual wellness visit, subsequent   Discussed applicable health maintenance/preventive health measures and advised and referred or ordered per patient preferences:  Health Maintenance  Topic Date Due   Zoster Vaccines- Shingrix (1 of 2) Discussed, per pt wishes   COVID-19 Vaccine (3 - 2023-24 season) Discussed, per pt wishes   Medicare Annual Wellness (AWV)  10/10/2023   DTaP/Tdap/Td (2 - Tdap) 05/02/2027   Pneumonia Vaccine 19+ Years old  Completed   INFLUENZA VACCINE  Completed   DEXA SCAN  Completed in 01/2021, due in 01/2023   Hepatitis C Screening  Completed   HPV VACCINES  Aged Out   COLONOSCOPY (Pts 45-64yr Insurance coverage will need to be confirmed)  Discontinued   Education and counseling on the following was provided based on the above review of health and a plan/checklist for the patient, along with additional information discussed, was provided for the patient in the patient instructions :   -Advised and counseled on a whole foods based healthy diet and regular exercise: discussed a heart healthy whole foods based diet at length. A summary of a healthy diet was provided in the Patient Instructions. -Recommended regular exercise and discussed options within the community.  -Advised yearly dental visits at minimum and regular eye exams -Advised and counseled on  safe alcohol limits, risks, etc.  Follow up: see patient instructions     Patient Instructions  I really enjoyed getting to talk with you today! I am available on Tuesdays and  Thursdays for virtual visits if you have any questions or concerns, or if I can be of any further assistance.   CHECKLIST FROM ANNUAL WELLNESS VISIT:  -Follow up (please call to schedule if not scheduled after visit):  -Inperson visit with your Primary Doctor office: yearly with your doctor inperson and per your doctor's recommendations -yearly for annual wellness visit with primary care office  Here is a list of your preventive care/health maintenance measures and the plan for each if any are due:  Health Maintenance  Topic Date Due   Zoster Vaccines- Shingrix (1 of 2) Per patient wishes   COVID-19 Vaccine (3 - 2023-24 season) Per patient wishes   Medicare Annual Wellness (AWV)  10/10/2023   DTaP/Tdap/Td (2 - Tdap) 05/02/2027   Pneumonia Vaccine 77 Years old  Completed   INFLUENZA VACCINE  Completed   DEXA SCAN  Done 01/3021, Due 01/3033 - can request with your primary care doctor.    Hepatitis C Screening  Completed   HPV VACCINES  Aged Out   COLONOSCOPY (Pts 45-475yrInsurance coverage will need to be confirmed)  Discontinued    -See a dentist at least yearly  -Get your eyes checked and then per your eye specialist's recommendations  -Other issues addressed today:  -I have included below further information regarding a healthy whole foods based diet, physical activity guidelines for adults, stress management and opportunities for social connections. I hope you find this information useful.   -----------------------------------------------------------------------------------------------------------------------------------------------------------------------------------------------------------------------------------------------------------  NUTRITION GUIDELINES: -eat real food: lots of colorful vegetables (half the plate) and fruits -5-7 servings of vegetables and fruits per day (fresh or steamed is best), exp. 2 servings of vegetables with lunch and dinner and 2 servings of  fruit per day. Berries and greens such as kale and collards are great choices.  -consume on a regular basis: whole grains (make sure first ingredient  on label contains the word "whole"), fresh fruits, fish, nuts, seeds, healthy oils (such as olive oil, avocado oil, grape seed oil) -may eat small amounts of dairy and lean meat on occasion, but avoid processed meats such as ham, bacon, lunch meat, etc. -drink water -try to avoid fast food and pre-packaged foods, processed meat -most experts advise limiting sodium to < 234m per day, should limit further is any chronic conditions such as high blood pressure, heart disease, diabetes, etc. The American Heart Association advised that < 15059mis is ideal -try to avoid foods that contain any ingredients with names you do not recognize  -try to avoid sugar/sweets (except for the natural sugar that occurs in fresh fruit) -try to avoid sweet drinks -try to avoid white rice, white bread, pasta (unless whole grain), white or yellow potatoes  EXERCISE GUIDELINES: -if you wish to increase your physical activity, do so gradually and with the approval of your doctor -STOP and seek medical care immediately if you have any chest pain, chest discomfort or trouble breathing when starting or increasing exercise  -move and stretch your body, legs, feet and arms when sitting for long periods -Physical activity guidelines for optimal health in adults: -least 150 minutes per week of aerobic exercise (can talk, but not sing) once approved by your doctor, 20-30 minutes of sustained activity or two 10 minute episodes of sustained activity every day.  -resistance training at least 2 days per week if approved by your doctor -balance exercises 3+ days per week:   Stand somewhere where you have something sturdy to hold onto if you lose balance.    1) lift up on toes, start with 5x per day and work up to 20x   2) stand and lift on leg straight out to the side so that foot is a  few inches of the floor, start with 5x each side and work up to 20x each side   3) stand on one foot, start with 5 seconds each side and work up to 20 seconds on each side  If you need ideas or help with getting more active:  -Silver sneakers https://tools.silversneakers.com  -Walk with a Doc: Hthttp://stephens-thompson.biz/-try to include resistance (weight lifting/strength building) and balance exercises twice per week: or the following link for ideas: htChessContest.frhtUpdateClothing.com.cy SOCIAL CONNECTIONS: -options in [place] you wish to engage in more social and exercise related activities:  -Silver sneakers https://tools.silversneakers.com  -Walk with a Doc: Hthttp://stephens-thompson.biz/-Check out the Gr[place]ley0+ section on the [place] GrHalliburton Companyhiking clubs, book clubs, cards and games, chess, exercise classes, aquatic classes and much more) - see the website for details: https://www.[place].gov/departments/parks-recreation/active-adults50  -YouTube has lots of exercise videos for different ages and abilities as well  -SmManhattana variety of indoor and outdoor inperson activities for adults). [phone] [address] -Virtual Online Classes (a variety of topics): see seniorplanet.org or call 88657-456-4138-consider volunteering at a school, hospice center, church, senior center or elsewhere           HaLucretia KernDO

## 2022-10-12 ENCOUNTER — Encounter: Payer: Self-pay | Admitting: Family Medicine

## 2022-10-22 ENCOUNTER — Encounter: Payer: Self-pay | Admitting: Family Medicine

## 2022-10-22 ENCOUNTER — Other Ambulatory Visit: Payer: Self-pay | Admitting: Family Medicine

## 2022-10-22 DIAGNOSIS — I10 Essential (primary) hypertension: Secondary | ICD-10-CM

## 2022-10-22 MED ORDER — BENAZEPRIL-HYDROCHLOROTHIAZIDE 20-12.5 MG PO TABS: 2.0000 | ORAL_TABLET | Freq: Every day | ORAL | 1 refills | Status: DC

## 2022-11-24 ENCOUNTER — Encounter: Payer: Self-pay | Admitting: Family Medicine

## 2022-11-24 ENCOUNTER — Encounter (HOSPITAL_BASED_OUTPATIENT_CLINIC_OR_DEPARTMENT_OTHER): Payer: Self-pay | Admitting: Orthopaedic Surgery

## 2022-11-24 NOTE — Telephone Encounter (Signed)

## 2022-11-25 ENCOUNTER — Other Ambulatory Visit (HOSPITAL_BASED_OUTPATIENT_CLINIC_OR_DEPARTMENT_OTHER): Payer: Self-pay | Admitting: Orthopaedic Surgery

## 2022-11-25 DIAGNOSIS — M23006 Cystic meniscus, unspecified meniscus, right knee: Secondary | ICD-10-CM

## 2022-12-03 IMAGING — MR MR KNEE*R* W/O CM
4 of 7 series · 22 of 40 positions shown · non-contrast
Comparison: Radiographs dated January 08, 2022

CLINICAL DATA: Medial knee pain, rule out meniscal tear.

EXAM:
MRI OF THE RIGHT KNEE WITHOUT CONTRAST
TECHNIQUE: Multiplanar, multisequence MR imaging of the right knee was
performed. No intravenous contrast was administered.

[Series 2: T2 fat-sat · axial · 4.0mm · 0.50mm/px · z∈[-64,+66]mm · 5 of 27 slices shown]
[im 1/27]
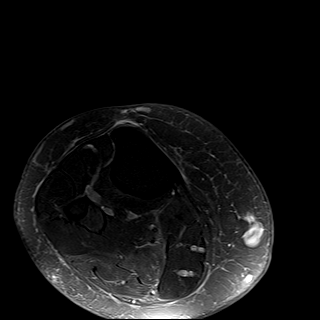
[im 7/27]
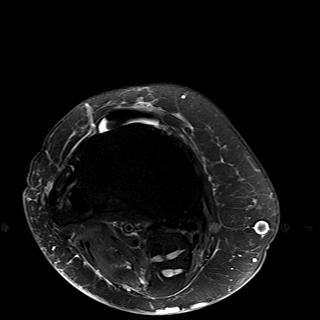
[im 14/27]
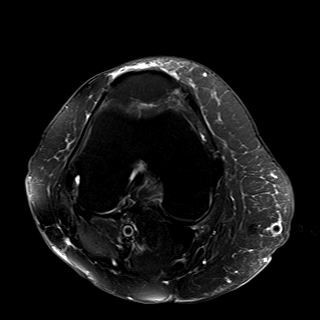
[im 20/27]
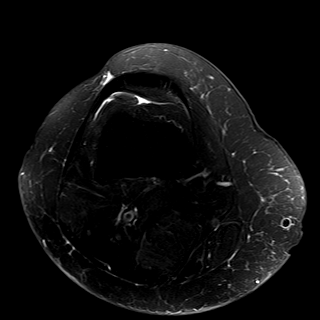
[im 27/27]
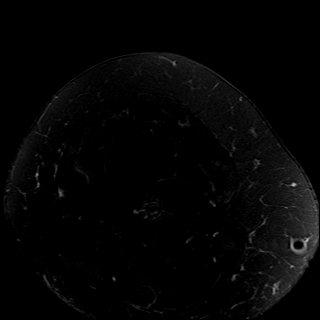

[Series 5: PD fat-sat · coronal · 3.0mm · 0.33mm/px · 8 of 37 slices shown (1 of 3)]
[im 1/37]
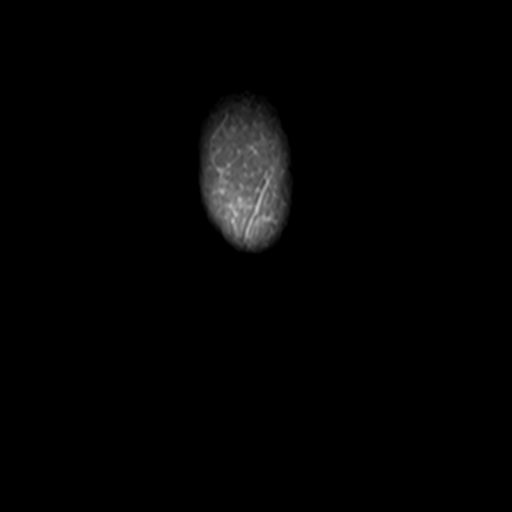
[im 6/37]
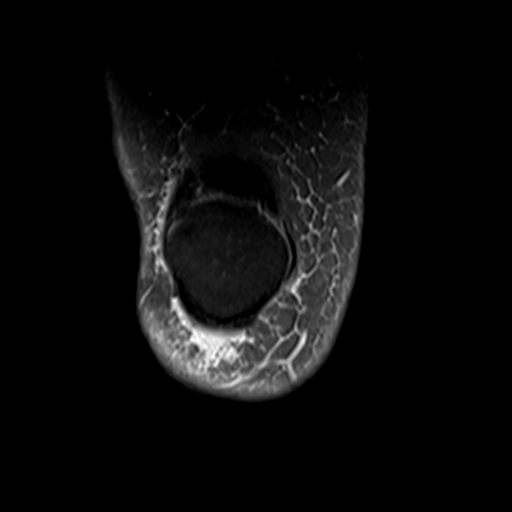
[im 11/37]
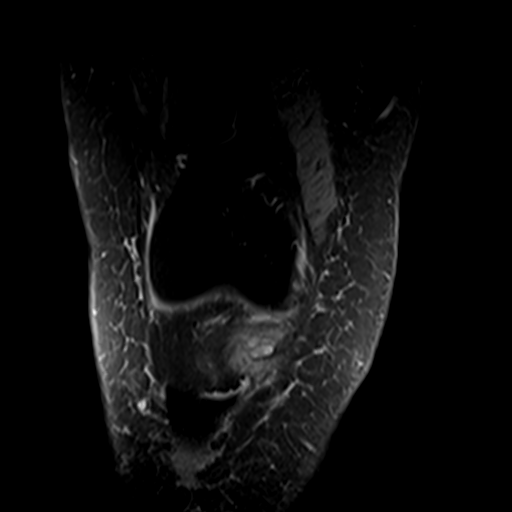
[im 16/37]
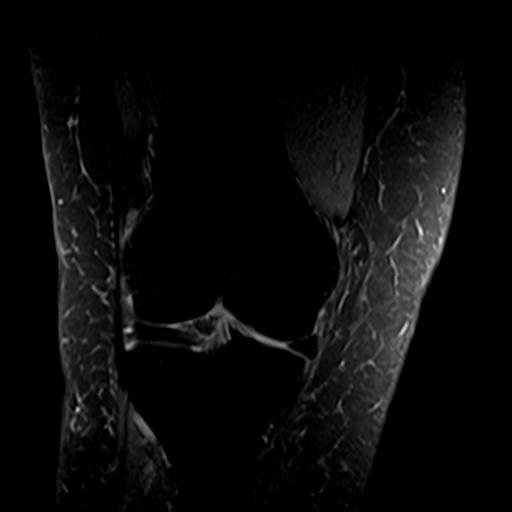
[im 21/37]
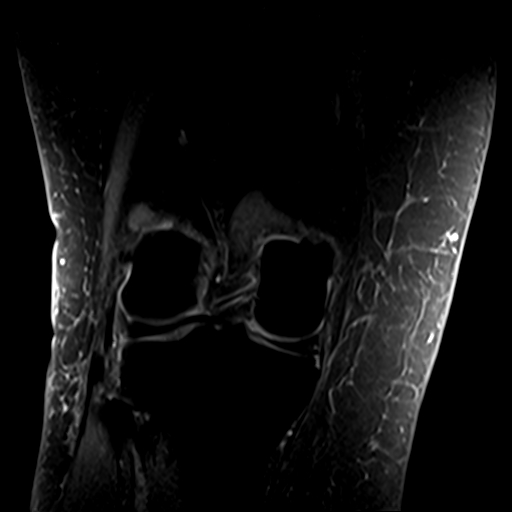
[im 26/37]
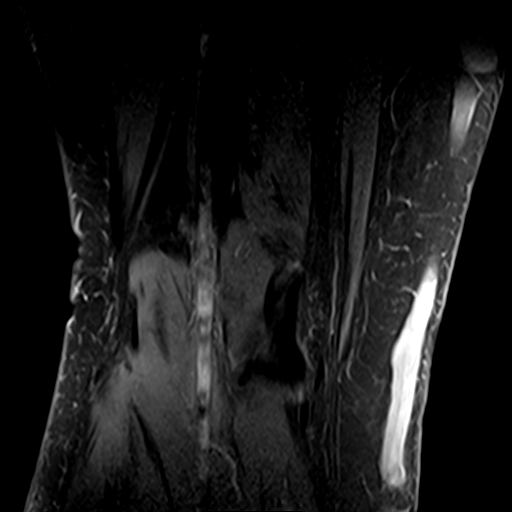
[im 31/37]
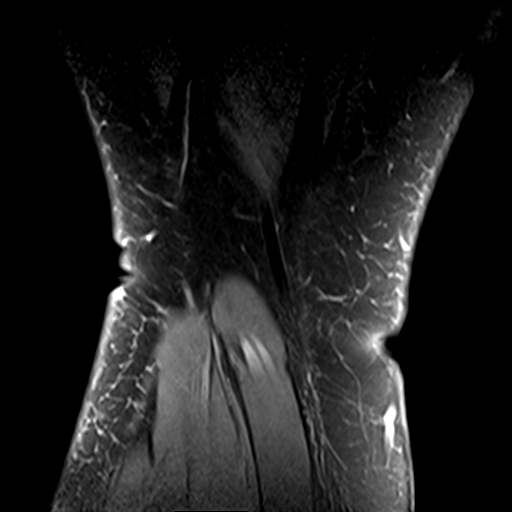
[im 37/37]
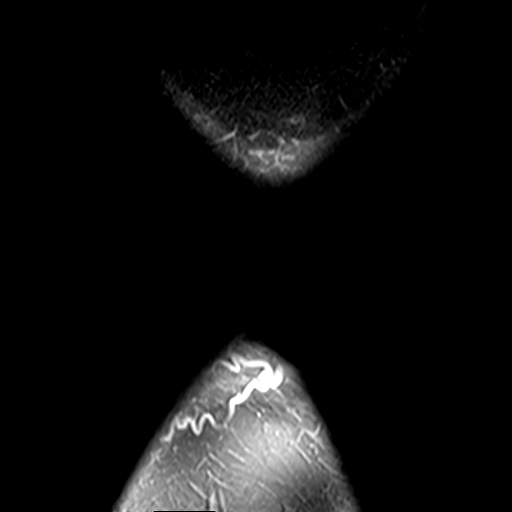

[Series 7: PD fat-sat · sagittal · 3.0mm · 0.33mm/px · 6 of 28 slices shown (2 of 3)]
[im 1/28]
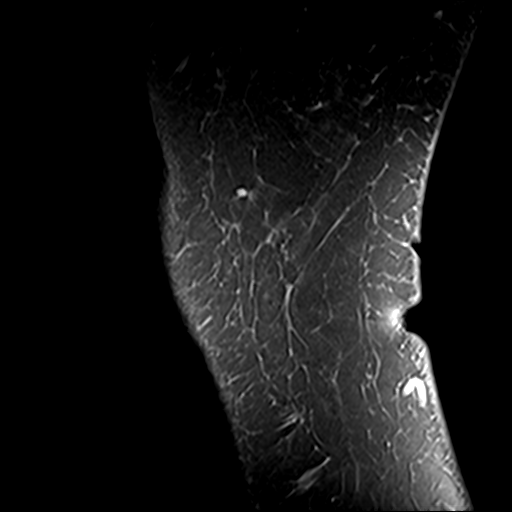
[im 6/28]
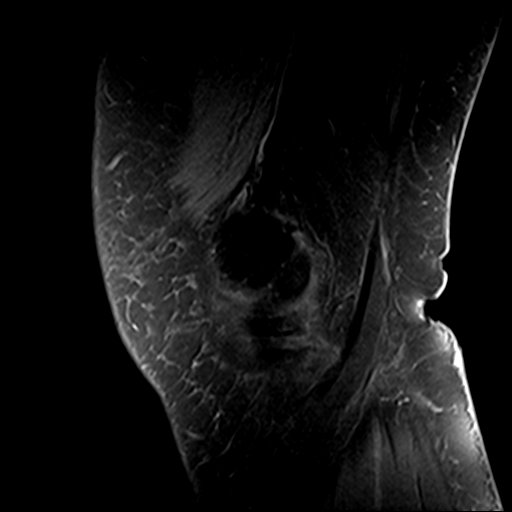
[im 11/28]
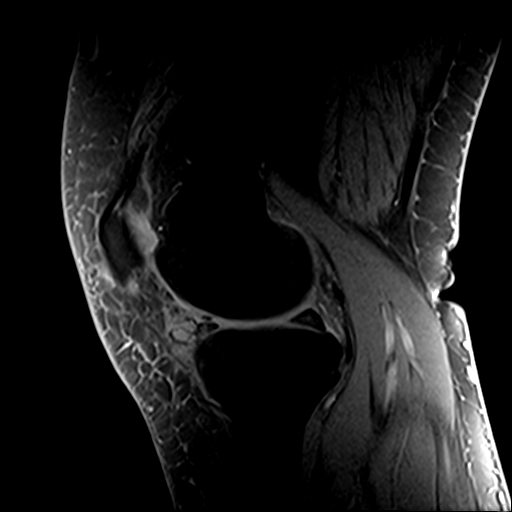
[im 17/28]
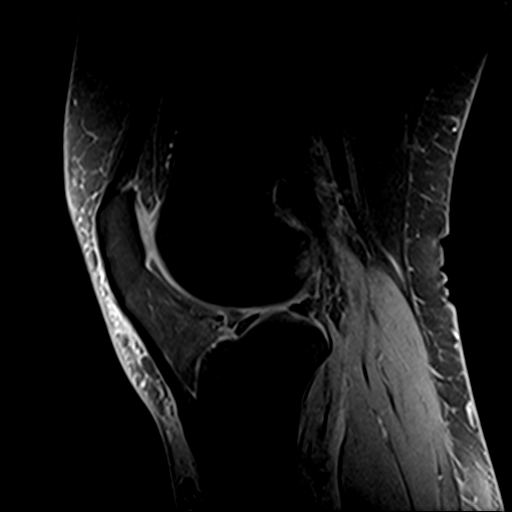
[im 22/28]
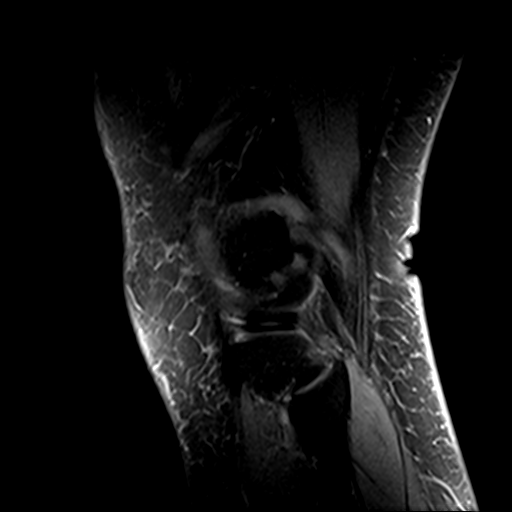
[im 28/28]
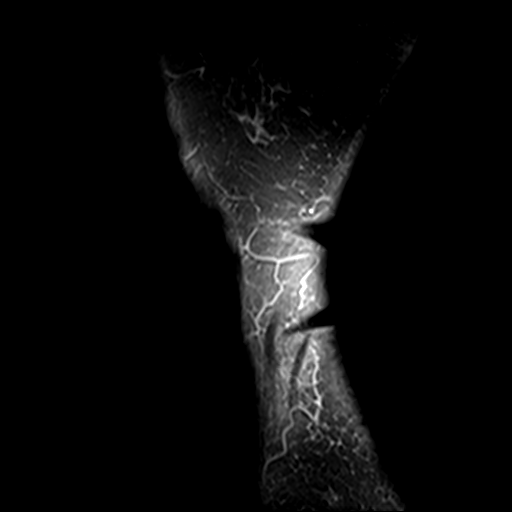

[Series 8: PD fat-sat · coronal · 2.3mm · 0.29mm/px · 3 of 15 slices shown (3 of 3)]
[im 1/15]
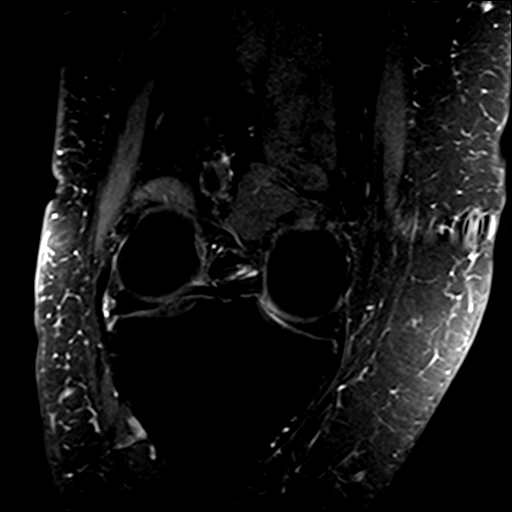
[im 8/15]
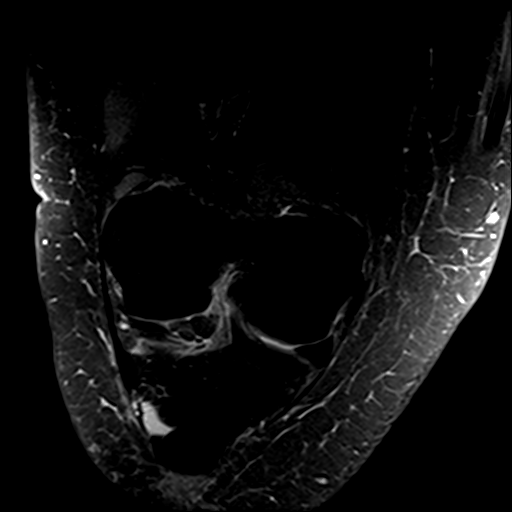
[im 15/15]
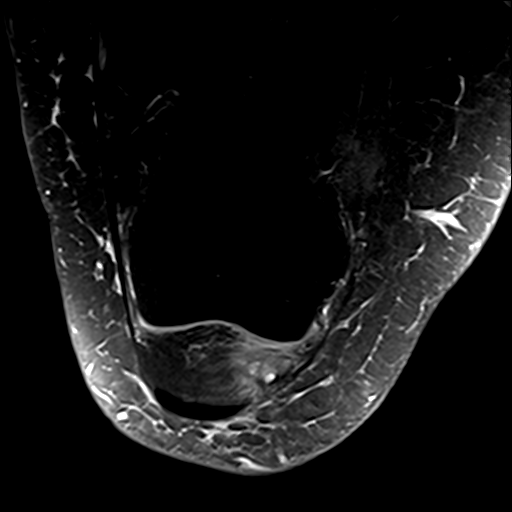

[22 of 40 positions shown; findings below may reference images not displayed]

FINDINGS: MENISCI

Medial: Intermediate signal of the posterior horn of the medial
meniscus without discrete tear. Parameniscal cyst about the anterior
horn of the medial meniscus.

Lateral: Degenerative changes without discrete tear.

LIGAMENTS

Cruciates: ACL and PCL are intact.

Collaterals: Medial collateral ligament is intact. Lateral
collateral ligament complex is intact.

CARTILAGE

Patellofemoral:  No chondral defect.

Medial:  No chondral defect.

Lateral:  No chondral defect.

JOINT: Trace joint effusion. Normal [place]. No plical
thickening.

POPLITEAL FOSSA: Popliteus tendon is intact. No Baker's cyst.

EXTENSOR MECHANISM: Intact quadriceps tendon. Intact patellar
tendon. Intact lateral patellar retinaculum. Intact medial patellar
retinaculum. Intact MPFL.

BONES: No aggressive osseous lesion. No fracture or dislocation.

Other: No fluid collection or hematoma. Muscles are normal.
Nonspecific prepatellar soft tissue edema.
IMPRESSION: 1. Intermediate signal of the posterior horn of the medial meniscus
without discrete tear. Parameniscal cyst about the anterior horn of
the medial meniscus.

2.  Lateral meniscus is intact.

3.  Cruciate and collateral ligaments are intact.

4. Marrow signal is within normal limits without evidence of
fracture or osseous lesion.

## 2022-12-13 DIAGNOSIS — H2513 Age-related nuclear cataract, bilateral: Secondary | ICD-10-CM | POA: Diagnosis not present

## 2022-12-13 DIAGNOSIS — H52203 Unspecified astigmatism, bilateral: Secondary | ICD-10-CM | POA: Diagnosis not present

## 2022-12-13 DIAGNOSIS — H524 Presbyopia: Secondary | ICD-10-CM | POA: Diagnosis not present

## 2022-12-13 DIAGNOSIS — H5203 Hypermetropia, bilateral: Secondary | ICD-10-CM | POA: Diagnosis not present
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
## 2022-12-25 ENCOUNTER — Ambulatory Visit (HOSPITAL_BASED_OUTPATIENT_CLINIC_OR_DEPARTMENT_OTHER): Payer: Medicare Other | Attending: Orthopaedic Surgery | Admitting: Physical Therapy

## 2022-12-25 ENCOUNTER — Encounter (HOSPITAL_BASED_OUTPATIENT_CLINIC_OR_DEPARTMENT_OTHER): Payer: Self-pay | Admitting: Physical Therapy

## 2022-12-25 ENCOUNTER — Other Ambulatory Visit: Payer: Self-pay

## 2022-12-25 DIAGNOSIS — R262 Difficulty in walking, not elsewhere classified: Secondary | ICD-10-CM | POA: Insufficient documentation

## 2022-12-25 DIAGNOSIS — M25661 Stiffness of right knee, not elsewhere classified: Secondary | ICD-10-CM | POA: Diagnosis not present

## 2022-12-25 DIAGNOSIS — M6281 Muscle weakness (generalized): Secondary | ICD-10-CM | POA: Insufficient documentation

## 2022-12-25 DIAGNOSIS — M25561 Pain in right knee: Secondary | ICD-10-CM | POA: Diagnosis not present

## 2022-12-25 DIAGNOSIS — M23006 Cystic meniscus, unspecified meniscus, right knee: Secondary | ICD-10-CM | POA: Diagnosis not present

## 2023-01-01 ENCOUNTER — Ambulatory Visit (HOSPITAL_BASED_OUTPATIENT_CLINIC_OR_DEPARTMENT_OTHER): Payer: Medicare Other | Admitting: Physical Therapy

## 2023-01-01 ENCOUNTER — Encounter (HOSPITAL_BASED_OUTPATIENT_CLINIC_OR_DEPARTMENT_OTHER): Payer: Self-pay | Admitting: Physical Therapy

## 2023-01-01 DIAGNOSIS — M6281 Muscle weakness (generalized): Secondary | ICD-10-CM

## 2023-01-01 DIAGNOSIS — R262 Difficulty in walking, not elsewhere classified: Secondary | ICD-10-CM | POA: Diagnosis not present

## 2023-01-01 DIAGNOSIS — M25561 Pain in right knee: Secondary | ICD-10-CM | POA: Diagnosis not present

## 2023-01-01 DIAGNOSIS — M23006 Cystic meniscus, unspecified meniscus, right knee: Secondary | ICD-10-CM | POA: Diagnosis not present

## 2023-01-01 DIAGNOSIS — M25661 Stiffness of right knee, not elsewhere classified: Secondary | ICD-10-CM
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
## 2023-01-09 ENCOUNTER — Encounter (HOSPITAL_BASED_OUTPATIENT_CLINIC_OR_DEPARTMENT_OTHER): Payer: Self-pay | Admitting: Physical Therapy

## 2023-01-09 ENCOUNTER — Ambulatory Visit (HOSPITAL_BASED_OUTPATIENT_CLINIC_OR_DEPARTMENT_OTHER): Payer: Medicare Other | Attending: Orthopaedic Surgery | Admitting: Physical Therapy

## 2023-01-09 ENCOUNTER — Encounter (INDEPENDENT_AMBULATORY_CARE_PROVIDER_SITE_OTHER): Payer: Medicare Other | Admitting: Family Medicine

## 2023-01-09 DIAGNOSIS — H00015 Hordeolum externum left lower eyelid: Secondary | ICD-10-CM | POA: Diagnosis not present

## 2023-01-09 DIAGNOSIS — M6281 Muscle weakness (generalized): Secondary | ICD-10-CM | POA: Diagnosis not present

## 2023-01-09 DIAGNOSIS — M25561 Pain in right knee: Secondary | ICD-10-CM | POA: Diagnosis not present

## 2023-01-09 DIAGNOSIS — M25661 Stiffness of right knee, not elsewhere classified: Secondary | ICD-10-CM | POA: Insufficient documentation

## 2023-01-09 DIAGNOSIS — R262 Difficulty in walking, not elsewhere classified: Secondary | ICD-10-CM | POA: Diagnosis not present

## 2023-01-09 NOTE — Telephone Encounter (Signed)
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
## 2023-01-10 ENCOUNTER — Encounter: Payer: Self-pay | Admitting: Radiology

## 2023-01-10 ENCOUNTER — Encounter (HOSPITAL_BASED_OUTPATIENT_CLINIC_OR_DEPARTMENT_OTHER): Payer: Self-pay | Admitting: Physical Therapy

## 2023-01-11 ENCOUNTER — Encounter (HOSPITAL_BASED_OUTPATIENT_CLINIC_OR_DEPARTMENT_OTHER): Payer: Self-pay | Admitting: Orthopaedic Surgery

## 2023-01-15 ENCOUNTER — Encounter (HOSPITAL_BASED_OUTPATIENT_CLINIC_OR_DEPARTMENT_OTHER): Payer: Self-pay | Admitting: Physical Therapy

## 2023-01-15 ENCOUNTER — Ambulatory Visit (HOSPITAL_BASED_OUTPATIENT_CLINIC_OR_DEPARTMENT_OTHER): Payer: Medicare Other | Admitting: Physical Therapy

## 2023-01-15 DIAGNOSIS — R262 Difficulty in walking, not elsewhere classified: Secondary | ICD-10-CM

## 2023-01-15 DIAGNOSIS — M25561 Pain in right knee: Secondary | ICD-10-CM

## 2023-01-15 DIAGNOSIS — M6281 Muscle weakness (generalized): Secondary | ICD-10-CM | POA: Diagnosis not present

## 2023-01-15 DIAGNOSIS — M25661 Stiffness of right knee, not elsewhere classified: Secondary | ICD-10-CM | POA: Diagnosis not present
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
## 2023-01-16 ENCOUNTER — Encounter (HOSPITAL_BASED_OUTPATIENT_CLINIC_OR_DEPARTMENT_OTHER): Payer: Self-pay | Admitting: Physical Therapy

## 2023-01-17 ENCOUNTER — Ambulatory Visit (HOSPITAL_BASED_OUTPATIENT_CLINIC_OR_DEPARTMENT_OTHER): Payer: Medicare Other | Admitting: Physical Therapy

## 2023-01-21 ENCOUNTER — Other Ambulatory Visit: Payer: Self-pay

## 2023-01-21 ENCOUNTER — Encounter: Payer: Self-pay | Admitting: Sports Medicine

## 2023-01-21 ENCOUNTER — Ambulatory Visit (INDEPENDENT_AMBULATORY_CARE_PROVIDER_SITE_OTHER): Payer: Medicare Other | Admitting: Sports Medicine

## 2023-01-21 ENCOUNTER — Encounter (HOSPITAL_BASED_OUTPATIENT_CLINIC_OR_DEPARTMENT_OTHER): Payer: Self-pay | Admitting: Physical Therapy

## 2023-01-21 DIAGNOSIS — Z9889 Other specified postprocedural states: Secondary | ICD-10-CM | POA: Diagnosis not present

## 2023-01-21 DIAGNOSIS — G8929 Other chronic pain: Secondary | ICD-10-CM

## 2023-01-21 DIAGNOSIS — M23006 Cystic meniscus, unspecified meniscus, right knee: Secondary | ICD-10-CM | POA: Diagnosis not present

## 2023-01-21 DIAGNOSIS — M7051 Other bursitis of knee, right knee: Secondary | ICD-10-CM

## 2023-01-21 DIAGNOSIS — M25561 Pain in right knee: Secondary | ICD-10-CM

## 2023-01-21 MED ORDER — BETAMETHASONE SOD PHOS & ACET 6 (3-3) MG/ML IJ SUSP
6.0000 mg | INTRAMUSCULAR | Status: AC | PRN
  Administered 2023-01-21: 6 mg via INTRA_ARTICULAR

## 2023-01-21 MED ORDER — LIDOCAINE HCL 1 % IJ SOLN
1.0000 mL | INTRAMUSCULAR | Status: AC | PRN
  Administered 2023-01-21: 1 mL

## 2023-01-21 NOTE — Progress Notes (Signed)
Knee pain for 8-12 months; progressive pain Dr. Sammuel Hines did surgery on August 15,2023  Has done PT (3-4 weeks) Knee has never really been "out of pain" Has tried PT(water) - no change  She is very frustrated with this knee and the pain that is has been causing her; has not been able to live the lifestyle she would like  Very stiff in the mornings; but once she works it out, she is fine.   Tylenol for pain Medial/anterior pain- states the pain goes down her lower leg *constant ache* - like a headache  *not swelling as much as it has prior to doing Aqua therapy* *warmness from the knee has decreased*  -has not tried Cortisone injection since the surgery

## 2023-01-21 NOTE — Patient Instructions (Signed)
[email or web address]   --> knee compression sleeve
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
## 2023-01-22 ENCOUNTER — Ambulatory Visit (HOSPITAL_BASED_OUTPATIENT_CLINIC_OR_DEPARTMENT_OTHER): Payer: Medicare Other | Admitting: Physical Therapy

## 2023-01-24 ENCOUNTER — Ambulatory Visit (HOSPITAL_BASED_OUTPATIENT_CLINIC_OR_DEPARTMENT_OTHER): Payer: Medicare Other | Admitting: Physical Therapy

## 2023-01-28 ENCOUNTER — Ambulatory Visit (HOSPITAL_BASED_OUTPATIENT_CLINIC_OR_DEPARTMENT_OTHER): Payer: Medicare Other | Admitting: Physical Therapy

## 2023-01-28 ENCOUNTER — Encounter (HOSPITAL_BASED_OUTPATIENT_CLINIC_OR_DEPARTMENT_OTHER): Payer: Self-pay | Admitting: Physical Therapy

## 2023-01-28 DIAGNOSIS — M25561 Pain in right knee: Secondary | ICD-10-CM

## 2023-01-28 DIAGNOSIS — R262 Difficulty in walking, not elsewhere classified: Secondary | ICD-10-CM | POA: Diagnosis not present

## 2023-01-28 DIAGNOSIS — M25661 Stiffness of right knee, not elsewhere classified: Secondary | ICD-10-CM

## 2023-01-28 DIAGNOSIS — M6281 Muscle weakness (generalized): Secondary | ICD-10-CM | POA: Diagnosis not present
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
## 2023-01-30 ENCOUNTER — Ambulatory Visit (HOSPITAL_BASED_OUTPATIENT_CLINIC_OR_DEPARTMENT_OTHER): Payer: Medicare Other | Admitting: Physical Therapy

## 2023-01-30 ENCOUNTER — Encounter (HOSPITAL_BASED_OUTPATIENT_CLINIC_OR_DEPARTMENT_OTHER): Payer: Self-pay | Admitting: Physical Therapy

## 2023-01-30 DIAGNOSIS — M25661 Stiffness of right knee, not elsewhere classified: Secondary | ICD-10-CM | POA: Diagnosis not present

## 2023-01-30 DIAGNOSIS — R262 Difficulty in walking, not elsewhere classified: Secondary | ICD-10-CM

## 2023-01-30 DIAGNOSIS — M6281 Muscle weakness (generalized): Secondary | ICD-10-CM

## 2023-01-30 DIAGNOSIS — M25561 Pain in right knee: Secondary | ICD-10-CM | POA: Diagnosis not present
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
## 2023-02-04 ENCOUNTER — Encounter: Payer: Self-pay | Admitting: Sports Medicine

## 2023-02-04 ENCOUNTER — Encounter: Payer: Self-pay | Admitting: Internal Medicine

## 2023-02-08 ENCOUNTER — Encounter (HOSPITAL_BASED_OUTPATIENT_CLINIC_OR_DEPARTMENT_OTHER): Payer: Self-pay | Admitting: Physical Therapy

## 2023-02-08 ENCOUNTER — Ambulatory Visit (HOSPITAL_BASED_OUTPATIENT_CLINIC_OR_DEPARTMENT_OTHER): Payer: Medicare Other | Attending: Orthopaedic Surgery | Admitting: Physical Therapy

## 2023-02-08 DIAGNOSIS — M25561 Pain in right knee: Secondary | ICD-10-CM

## 2023-02-08 DIAGNOSIS — M6281 Muscle weakness (generalized): Secondary | ICD-10-CM

## 2023-02-08 DIAGNOSIS — R262 Difficulty in walking, not elsewhere classified: Secondary | ICD-10-CM | POA: Diagnosis not present

## 2023-02-08 DIAGNOSIS — M25661 Stiffness of right knee, not elsewhere classified: Secondary | ICD-10-CM
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
## 2023-02-11 ENCOUNTER — Ambulatory Visit (HOSPITAL_BASED_OUTPATIENT_CLINIC_OR_DEPARTMENT_OTHER): Payer: Medicare Other | Admitting: Physical Therapy

## 2023-02-11 ENCOUNTER — Encounter (HOSPITAL_BASED_OUTPATIENT_CLINIC_OR_DEPARTMENT_OTHER): Payer: Self-pay | Admitting: Physical Therapy

## 2023-02-11 DIAGNOSIS — M25561 Pain in right knee: Secondary | ICD-10-CM | POA: Diagnosis not present

## 2023-02-11 DIAGNOSIS — M6281 Muscle weakness (generalized): Secondary | ICD-10-CM

## 2023-02-11 DIAGNOSIS — R262 Difficulty in walking, not elsewhere classified: Secondary | ICD-10-CM

## 2023-02-11 DIAGNOSIS — M25661 Stiffness of right knee, not elsewhere classified: Secondary | ICD-10-CM
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
## 2023-02-13 ENCOUNTER — Encounter: Payer: Self-pay | Admitting: Family Medicine

## 2023-02-22 ENCOUNTER — Encounter: Payer: Self-pay | Admitting: Sports Medicine

## 2023-02-22 ENCOUNTER — Ambulatory Visit (HOSPITAL_BASED_OUTPATIENT_CLINIC_OR_DEPARTMENT_OTHER): Payer: Medicare Other | Admitting: Physical Therapy

## 2023-02-22 ENCOUNTER — Encounter (HOSPITAL_BASED_OUTPATIENT_CLINIC_OR_DEPARTMENT_OTHER): Payer: Self-pay | Admitting: Physical Therapy

## 2023-02-22 DIAGNOSIS — M25561 Pain in right knee: Secondary | ICD-10-CM

## 2023-02-22 DIAGNOSIS — M25661 Stiffness of right knee, not elsewhere classified: Secondary | ICD-10-CM

## 2023-02-22 DIAGNOSIS — M6281 Muscle weakness (generalized): Secondary | ICD-10-CM

## 2023-02-22 DIAGNOSIS — R262 Difficulty in walking, not elsewhere classified: Secondary | ICD-10-CM
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
## 2023-02-25 ENCOUNTER — Ambulatory Visit (HOSPITAL_BASED_OUTPATIENT_CLINIC_OR_DEPARTMENT_OTHER): Payer: Medicare Other | Admitting: Physical Therapy

## 2023-02-25 ENCOUNTER — Encounter (HOSPITAL_BASED_OUTPATIENT_CLINIC_OR_DEPARTMENT_OTHER): Payer: Self-pay | Admitting: Physical Therapy

## 2023-02-25 DIAGNOSIS — M25661 Stiffness of right knee, not elsewhere classified: Secondary | ICD-10-CM | POA: Diagnosis not present

## 2023-02-25 DIAGNOSIS — M6281 Muscle weakness (generalized): Secondary | ICD-10-CM

## 2023-02-25 DIAGNOSIS — R262 Difficulty in walking, not elsewhere classified: Secondary | ICD-10-CM | POA: Diagnosis not present

## 2023-02-25 DIAGNOSIS — M25561 Pain in right knee: Secondary | ICD-10-CM | POA: Diagnosis not present

## 2023-02-26 ENCOUNTER — Encounter: Payer: Self-pay | Admitting: Family Medicine

## 2023-02-26 ENCOUNTER — Other Ambulatory Visit: Payer: Self-pay | Admitting: Sports Medicine

## 2023-02-26 MED ORDER — MELOXICAM 15 MG PO TABS: 15.0000 mg | ORAL_TABLET | Freq: Every day | ORAL | 1 refills | Status: DC

## 2023-02-27 ENCOUNTER — Ambulatory Visit (HOSPITAL_BASED_OUTPATIENT_CLINIC_OR_DEPARTMENT_OTHER): Payer: Medicare Other | Admitting: Physical Therapy

## 2023-02-27 ENCOUNTER — Encounter (HOSPITAL_BASED_OUTPATIENT_CLINIC_OR_DEPARTMENT_OTHER): Payer: Self-pay | Admitting: Physical Therapy

## 2023-02-27 ENCOUNTER — Encounter: Payer: Self-pay | Admitting: Family Medicine

## 2023-02-27 DIAGNOSIS — M25561 Pain in right knee: Secondary | ICD-10-CM | POA: Diagnosis not present

## 2023-02-27 DIAGNOSIS — M25661 Stiffness of right knee, not elsewhere classified: Secondary | ICD-10-CM | POA: Diagnosis not present

## 2023-02-27 DIAGNOSIS — R262 Difficulty in walking, not elsewhere classified: Secondary | ICD-10-CM | POA: Diagnosis not present

## 2023-02-27 DIAGNOSIS — M6281 Muscle weakness (generalized): Secondary | ICD-10-CM | POA: Diagnosis not present

## 2023-02-27 DIAGNOSIS — J302 Other seasonal allergic rhinitis: Secondary | ICD-10-CM
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
## 2023-02-27 NOTE — Telephone Encounter (Signed)
Please see the MyChart message reply(ies) for my assessment and plan.  The patient gave consent for this Medical Advice Message and is aware that it may result in a bill to their insurance company as well as the possibility that this may result in a co-payment or deductible. They are an established patient, but are not seeking medical advice exclusively about a problem treated during an in person or video visit in the last 7 days. I did not recommend an in person or video visit within 7 days of my reply.  I spent a total of 10 minutes cumulative time within 7 days through MyChart messaging Melburn Treiber, MD  

## 2023-02-28 ENCOUNTER — Telehealth: Payer: Self-pay

## 2023-03-01 NOTE — Telephone Encounter (Signed)
Pt was schedule for VIT 5/28,6/4/,6/11 pre-meds will be sent in later closer to appt.

## 2023-03-05 ENCOUNTER — Encounter (HOSPITAL_BASED_OUTPATIENT_CLINIC_OR_DEPARTMENT_OTHER): Payer: Self-pay | Admitting: Physical Therapy

## 2023-03-05 ENCOUNTER — Ambulatory Visit (HOSPITAL_BASED_OUTPATIENT_CLINIC_OR_DEPARTMENT_OTHER): Payer: Medicare Other | Admitting: Physical Therapy

## 2023-03-05 DIAGNOSIS — M25561 Pain in right knee: Secondary | ICD-10-CM

## 2023-03-05 DIAGNOSIS — M25661 Stiffness of right knee, not elsewhere classified: Secondary | ICD-10-CM | POA: Diagnosis not present

## 2023-03-05 DIAGNOSIS — M6281 Muscle weakness (generalized): Secondary | ICD-10-CM

## 2023-03-05 DIAGNOSIS — R262 Difficulty in walking, not elsewhere classified: Secondary | ICD-10-CM | POA: Diagnosis not present
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
## 2023-03-06 ENCOUNTER — Encounter: Payer: Self-pay | Admitting: Sports Medicine

## 2023-03-08 ENCOUNTER — Encounter: Payer: Self-pay | Admitting: Sports Medicine

## 2023-03-08 ENCOUNTER — Other Ambulatory Visit: Payer: Self-pay | Admitting: Sports Medicine

## 2023-03-08 DIAGNOSIS — G8929 Other chronic pain: Secondary | ICD-10-CM

## 2023-03-11 ENCOUNTER — Encounter: Payer: Self-pay | Admitting: Sports Medicine

## 2023-03-13 ENCOUNTER — Ambulatory Visit (HOSPITAL_BASED_OUTPATIENT_CLINIC_OR_DEPARTMENT_OTHER): Payer: Medicare Other | Admitting: Physical Therapy

## 2023-03-16 ENCOUNTER — Ambulatory Visit
Admission: RE | Admit: 2023-03-16 | Discharge: 2023-03-16 | Disposition: A | Discharge status: Home / Self Care | Payer: Federal, State, Local not specified - PPO | Source: Ambulatory Visit | Attending: Sports Medicine | Admitting: Sports Medicine

## 2023-03-16 DIAGNOSIS — M25461 Effusion, right knee: Secondary | ICD-10-CM | POA: Diagnosis not present

## 2023-03-16 DIAGNOSIS — M25561 Pain in right knee: Secondary | ICD-10-CM | POA: Diagnosis not present

## 2023-03-16 DIAGNOSIS — G8929 Other chronic pain: Secondary | ICD-10-CM

## 2023-03-17 ENCOUNTER — Encounter: Payer: Self-pay | Admitting: Sports Medicine

## 2023-03-18 ENCOUNTER — Other Ambulatory Visit: Payer: Self-pay | Admitting: Internal Medicine

## 2023-03-18 DIAGNOSIS — T63481D Toxic effect of venom of other arthropod, accidental (unintentional), subsequent encounter: Secondary | ICD-10-CM

## 2023-03-19 ENCOUNTER — Encounter: Payer: Self-pay | Admitting: Sports Medicine

## 2023-03-19 ENCOUNTER — Ambulatory Visit (INDEPENDENT_AMBULATORY_CARE_PROVIDER_SITE_OTHER): Payer: Medicare Other | Admitting: Sports Medicine

## 2023-03-19 ENCOUNTER — Ambulatory Visit: Payer: Medicare Other | Admitting: Internal Medicine

## 2023-03-19 DIAGNOSIS — M25561 Pain in right knee: Secondary | ICD-10-CM

## 2023-03-19 DIAGNOSIS — G8929 Other chronic pain: Secondary | ICD-10-CM | POA: Diagnosis not present

## 2023-03-19 DIAGNOSIS — M1711 Unilateral primary osteoarthritis, right knee: Secondary | ICD-10-CM | POA: Diagnosis not present

## 2023-03-19 DIAGNOSIS — Z9889 Other specified postprocedural states: Secondary | ICD-10-CM

## 2023-03-19 MED ORDER — CELECOXIB 100 MG PO CAPS: 100.0000 mg | ORAL_CAPSULE | Freq: Two times a day (BID) | ORAL | 0 refills | Status: DC
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
## 2023-03-20 ENCOUNTER — Encounter: Payer: Self-pay | Admitting: Sports Medicine

## 2023-03-25 ENCOUNTER — Ambulatory Visit: Payer: Medicare Other | Admitting: Family Medicine

## 2023-03-25 ENCOUNTER — Encounter: Payer: Self-pay | Admitting: Family Medicine

## 2023-03-25 VITALS — BP 122/60 | HR 62 | Temp 98.2°F | Resp 18 | Ht 62.0 in | Wt 166.2 lb

## 2023-03-25 DIAGNOSIS — S61203A Unspecified open wound of left middle finger without damage to nail, initial encounter: Secondary | ICD-10-CM | POA: Diagnosis not present

## 2023-03-25 NOTE — Patient Instructions (Addendum)
It was good to see you today Recommend the Shingrix vaccine series at your pharmacy if not done already If you see any sign of infection from your finger please let me know- however I hope it will do ok  Please see me for a physical at your convenience
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
## 2023-03-26 ENCOUNTER — Encounter: Payer: Self-pay | Admitting: Sports Medicine

## 2023-03-26 ENCOUNTER — Encounter: Payer: Self-pay | Admitting: Internal Medicine

## 2023-03-27 ENCOUNTER — Other Ambulatory Visit: Payer: Self-pay

## 2023-03-27 MED ORDER — FAMOTIDINE 20 MG PO TABS: ORAL_TABLET | ORAL | 0 refills | Status: DC

## 2023-03-27 MED ORDER — CETIRIZINE HCL 10 MG PO TABS: ORAL_TABLET | ORAL | 0 refills | Status: DC

## 2023-03-27 MED ORDER — PREDNISONE 20 MG PO TABS: ORAL_TABLET | ORAL | 0 refills | Status: DC

## 2023-03-27 MED ORDER — MONTELUKAST SODIUM 10 MG PO TABS: ORAL_TABLET | ORAL | 0 refills | Status: DC

## 2023-04-02 ENCOUNTER — Other Ambulatory Visit: Payer: Self-pay

## 2023-04-02 ENCOUNTER — Encounter: Payer: Self-pay | Admitting: Internal Medicine

## 2023-04-02 ENCOUNTER — Ambulatory Visit (INDEPENDENT_AMBULATORY_CARE_PROVIDER_SITE_OTHER): Payer: Medicare Other | Admitting: Internal Medicine

## 2023-04-02 VITALS — BP 122/64 | HR 73 | Temp 97.8°F | Resp 18 | Ht 62.0 in | Wt 166.4 lb

## 2023-04-02 DIAGNOSIS — T63481A Toxic effect of venom of other arthropod, accidental (unintentional), initial encounter: Secondary | ICD-10-CM

## 2023-04-02 DIAGNOSIS — T782XXA Anaphylactic shock, unspecified, initial encounter: Secondary | ICD-10-CM | POA: Diagnosis not present

## 2023-04-02 DIAGNOSIS — T63481D Toxic effect of venom of other arthropod, accidental (unintentional), subsequent encounter: Secondary | ICD-10-CM

## 2023-04-02 NOTE — Progress Notes (Unsigned)
RAPID DESENSITIZATION Note  RE: Lindsey Shepard MRN: [id number] DOB: [birth year] Date of Office Visit: 04/02/2023  Subjective:  Patient presents today for rapid desensitization.  Interval History: Patient has not been ill, she has taken all premedications as per protocol.  Recent/Current History: Pulmonary disease: {Blank single:19197::"yes","no"} Cardiac disease: {Blank single:19197::"yes","no"} Respiratory infection: {Blank single:19197::"yes","no"} Rash: {Blank single:19197::"yes","no"} Itch: {Blank single:19197::"yes","no"} Swelling: {Blank single:19197::"yes","no"} Cough: {Blank single:19197::"yes","no"} Shortness of breath: {Blank single:19197::"yes","no"} Runny/stuffy nose: {Blank single:19197::"yes","no"} Itchy eyes: {Blank single:19197::"yes","no"} Beta-blocker use: {Blank single:19197::"yes","no"}  Patient/guardian was informed of the procedure with verbalized understanding of the risk of anaphylaxis. Consent has been signed.   Medication List:  Current Outpatient Medications  Medication Sig Dispense Refill   aspirin EC 81 MG tablet Take 81 mg by mouth daily. Swallow whole.     benazepril-hydrochlorthiazide (LOTENSIN HCT) 20-12.5 MG tablet Take 2 tablets by mouth daily. 180 tablet 1   celecoxib (CELEBREX) 100 MG capsule Take 1 capsule (100 mg total) by mouth 2 (two) times daily. 30 capsule 0   cetirizine (ZYRTEC) 10 MG tablet Take 1 tab  morning/day before and day of RUSH appt 6 tablet 0   EPINEPHrine 0.3 mg/0.3 mL IJ SOAJ injection Inject 0.3 mg into the muscle as needed for anaphylaxis. 1 each 0   famotidine (PEPCID) 20 MG tablet Take 1 tab twice daily day before and day of RUSH appt 12 tablet 0   glucosamine-chondroitin 500-400 MG tablet Take 1 tablet by mouth daily.     montelukast (SINGULAIR) 10 MG tablet Take 1 tab morning day before & morning of RUSH appt 6 tablet 0   Multiple Vitamin (MULTI-VITAMINS) TABS Take by mouth.     Omega-3 Fatty Acids (FISH OIL) 1000  MG CAPS 1 TAB DAILY     OVER THE COUNTER MEDICATION OTC Calcium and Magnesium taking 2 tabs daily     predniSONE (DELTASONE) 20 MG tablet Take 2 tabs morning day before & morning of RUSH appt. 12 tablet 0   No current facility-administered medications for this visit.   Allergies: Allergies  Allergen Reactions   Hydrocodone-Acetaminophen Nausea Only   Influenza Vaccines     Pt had localized redness following high dose flu vaccine    Meloxicam    Mixed Vespid Venom     Yellow jacket,yellow hornet,white face hornet, paper wasp   Motrin [Ibuprofen] Swelling   Nabumetone Swelling    swelling   Percocet [Oxycodone-Acetaminophen] Nausea And Vomiting   Vicodin [Hydrocodone-Acetaminophen] Nausea Only   I reviewed her past medical history, social history, family history, and environmental history and no significant changes have been reported from her previous visit.  ROS: Negative except as per HPI.  Objective: BP 116/62 (BP Location: Left Arm, Patient Position: Sitting, Cuff Size: Normal)   Pulse 77   Temp 97.9 F (36.6 C) (Temporal)   Resp 20   Ht 5\' 2"  (1.575 m)   Wt 166 lb 6.4 oz (75.5 kg)   SpO2 98%   BMI 30.43 kg/m  Body mass index is 30.43 kg/m.   General Appearance:  Alert, cooperative, no distress, appears stated age  Head:  Normocephalic, without obvious abnormality, atraumatic  Eyes:  Conjunctiva clear, EOM's intact  Nose: Nares normal  Throat: Lips, tongue normal; teeth and gums normal, *** posterior oropharnyx  Neck: Supple, symmetrical  Lungs:   ***, Respirations unlabored, no coughing  Heart:  Appears well perfused  Extremities: No edema  Skin: Skin color, texture, turgor normal, no rashes or lesions on visualized portions of  skin  Neurologic: No gross deficits     Diagnostics: Spirometry:  Tracings reviewed. Her effort: It was hard to get consistent efforts and there is a question as to whether this reflects a maximal maneuver. FVC: 1.68L FEV1: 1.45L,  78% predicted FEV1/FVC ratio: 86% Interpretation: No overt abnormalities noted given today's efforts.  Please see scanned spirometry results for details.  PROCEDURES:  Patient received the following doses every hour: Step 1: 0.05 mL of each vial: 1 mcg/mL  wasp,   or  3 mcg/mL Mixed Vespid Step 2: 0.1 mL of each vial: 1 mcg/mL Wasp or  3 mcg/mL Mixed Vespid Step 3: 0.2 mL of each vial: 1 mcg/mL Wasp or  3 mcg/mL Mixed Vespid Step 4: 0.5 mL of each vial: 1 mcg/mL Wasp or  3 mcg/mL Mixed Vespid Step 5: Discharge from clinic one hour after last injection pending no reaction.  Patient was observed for 1 hour after the last dose.   Procedure started at *** Procedure ended at ***   ASSESSMENT/PLAN:   Patient has tolerated the rapid desensitization protocol.  Next appointment: Return in 7 days for Day 8 Venom rapid desensitization protocol

## 2023-04-08 ENCOUNTER — Other Ambulatory Visit: Payer: Self-pay

## 2023-04-08 ENCOUNTER — Telehealth: Payer: Self-pay

## 2023-04-08 ENCOUNTER — Emergency Department (HOSPITAL_BASED_OUTPATIENT_CLINIC_OR_DEPARTMENT_OTHER)
Admission: EM | Admit: 2023-04-08 | Discharge: 2023-04-08 | Disposition: A | Discharge status: Home / Self Care | Payer: Medicare Other | Attending: Emergency Medicine | Admitting: Emergency Medicine

## 2023-04-08 DIAGNOSIS — T782XXA Anaphylactic shock, unspecified, initial encounter: Secondary | ICD-10-CM

## 2023-04-08 DIAGNOSIS — I10 Essential (primary) hypertension: Secondary | ICD-10-CM | POA: Diagnosis not present

## 2023-04-08 DIAGNOSIS — E1165 Type 2 diabetes mellitus with hyperglycemia: Secondary | ICD-10-CM | POA: Diagnosis not present

## 2023-04-08 DIAGNOSIS — Z7982 Long term (current) use of aspirin: Secondary | ICD-10-CM | POA: Diagnosis not present

## 2023-04-08 DIAGNOSIS — R739 Hyperglycemia, unspecified: Secondary | ICD-10-CM | POA: Diagnosis not present

## 2023-04-08 DIAGNOSIS — T7840XA Allergy, unspecified, initial encounter: Secondary | ICD-10-CM | POA: Diagnosis present

## 2023-04-08 LAB — BASIC METABOLIC PANEL
Anion gap: 9 (ref 5–15)
BUN: 18 mg/dL (ref 8–23)
CO2: 26 mmol/L (ref 22–32)
Calcium: 9.5 mg/dL (ref 8.9–10.3)
Chloride: 101 mmol/L (ref 98–111)
Creatinine, Ser: 0.87 mg/dL (ref 0.44–1.00)
GFR, Estimated: >60 mL/min (ref >60)
Glucose, Bld: 205 mg/dL — ABNORMAL HIGH (ref 70–99)
Potassium: 3.7 mmol/L (ref 3.5–5.1)
Sodium: 136 mmol/L (ref 135–145)

## 2023-04-08 LAB — CBC
HCT: 41.2 % (ref 36.0–46.0)
Hemoglobin: 13.7 g/dL (ref 12.0–15.0)
MCH: 30.2 pg (ref 26.0–34.0)
MCHC: 33.3 g/dL (ref 30.0–36.0)
MCV: 90.9 fL (ref 80.0–100.0)
Platelets: 633 10*3/uL — ABNORMAL HIGH (ref 150–400)
RBC: 4.53 MIL/uL (ref 3.87–5.11)
RDW: 14.3 % (ref 11.5–15.5)
WBC: 8.6 10*3/uL (ref 4.0–10.5)
nRBC: 0 % (ref 0.0–0.2)

## 2023-04-08 MED ORDER — DIPHENHYDRAMINE HCL 50 MG/ML IJ SOLN
25.0000 mg | Freq: Once | INTRAMUSCULAR | Status: AC
  Administered 2023-04-08: 25 mg via INTRAVENOUS
  Filled 2023-04-08: qty 1

## 2023-04-08 MED ORDER — METHYLPREDNISOLONE SODIUM SUCC 125 MG IJ SOLR
125.0000 mg | Freq: Once | INTRAMUSCULAR | Status: AC
  Administered 2023-04-08: 125 mg via INTRAVENOUS
  Filled 2023-04-08: qty 2

## 2023-04-08 MED ORDER — SODIUM CHLORIDE 0.9 % IV BOLUS
1000.0000 mL | Freq: Once | INTRAVENOUS | Status: AC
  Administered 2023-04-08: 1000 mL via INTRAVENOUS

## 2023-04-08 MED ORDER — PREDNISONE 50 MG PO TABS: 50.0000 mg | ORAL_TABLET | Freq: Every day | ORAL | 0 refills | Status: DC

## 2023-04-08 MED ORDER — EPINEPHRINE 0.3 MG/0.3ML IJ SOAJ: 0.3000 mg | INTRAMUSCULAR | 0 refills | Status: DC | PRN

## 2023-04-08 MED ORDER — FAMOTIDINE IN NACL 20-0.9 MG/50ML-% IV SOLN
20.0000 mg | Freq: Once | INTRAVENOUS | Status: AC
  Administered 2023-04-08: 20 mg via INTRAVENOUS
  Filled 2023-04-08: qty 50

## 2023-04-08 MED ORDER — SODIUM CHLORIDE 0.9 % IV SOLN: INTRAVENOUS | Status: DC

## 2023-04-08 NOTE — Telephone Encounter (Signed)
Pt called this morning stating she was bitten by a hornet yesterday, and she took some benadryl and had to use her epipen. Pt states that when she woke up this morning she had swelling from the tips of her fingers to almost her elbow. So she was wondering if she needs to take her pre-med's for RUSH and come in tomorrow? Or does she need to go to ER for evaluation? After speaking with Dr. Marlynn Perking pt was advise that we will be switching to traditional build-up, and she needs to come in 72 hrs after the bite to get her next shot if she's was feeling better. Pt is concern about the arm that is swelling, so pt was advise to go ER/urgent care if the swelling is getting worse or she's feeling unsettle about it. Pt was also advise to keep the office updated on her condition.

## 2023-04-08 NOTE — Discharge Instructions (Addendum)
Your blood sugar is elevated today.  This is likely due to the prednisone.  You are going home on prednisone, so make sure you eat low carbohydrate meals.  Eat foods high in fiber and in protein.  You will need to follow up with your PCP to get your blood sugar rechecked once you are off the steroids.

## 2023-04-08 NOTE — ED Triage Notes (Signed)
Patient presents to ED via POV from home. Patient was stung on the left hand last night by a bee around 1830. Patient reports she developed shortness of breath and rash, prompting her to use her epi pen. Patient here today with continued to shortness of breath and hand edema. Airway is patent. Able to speak full, complete sentences.

## 2023-04-08 NOTE — ED Notes (Signed)
Discharge instructions reviewed with patient. Patient verbalizes understanding, no further questions at this time. Medications/prescriptions and follow up information provided. No acute distress noted at time of departure.  

## 2023-04-08 NOTE — ED Provider Notes (Signed)
[facility] Provider Note   CSN: [id number] Arrival date & time: 04/08/23  1016     History  Chief Complaint  Patient presents with   Insect Bite    Lindsey Shepard is a 77 y.o. female.  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  Pt said she was stung by a bee on her left hand last night around 1830.  She developed diffuse hives, sob, and felt like she was going to pass out.  She was given her epi shot by a neighbor who was a Char[employer] fundraiser around [employer].  She took benadryl at 1900 and at 2230.  Pt woke up this am with continued hives, and increased left hand/arm swelling.  Pt called her allergist and they told her to come here if sx are worse.  She just had allergy shots (mixed vespid) on 5/28 and was doing the "RUSH" protocol.  She did not have any trouble after the allergy shot.       Home Medications Prior to Admission medications   Medication Sig Start Date End Date Taking? Authorizing Provider  EPINEPHrine 0.3 mg/0.3 mL IJ SOAJ injection Inject 0.3 mg into the muscle as needed for anaphylaxis. 04/08/23  Yes Jacalyn Lefevre, MD  predniSONE (DELTASONE) 50 MG tablet Take 1 tablet (50 mg total) by mouth daily with breakfast. 04/08/23  Yes Jacalyn Lefevre, MD  aspirin EC 81 MG tablet Take 81 mg by mouth daily. Swallow whole.    [provider]  benazepril-hydrochlorthiazide (LOTENSIN HCT) 20-12.5 MG tablet Take 2 tablets by mouth daily. 10/22/22   Copland, Gwenlyn Found, MD  celecoxib (CELEBREX) 100 MG capsule Take 1 capsule (100 mg total) by mouth 2 (two) times daily. 03/19/23   Madelyn Brunner, DO  cetirizine (ZYRTEC) 10 MG tablet Take 1 tab  morning/day before and day of RUSH appt 03/27/23   Ferol Luz, MD  EPINEPHrine 0.3 mg/0.3 mL IJ SOAJ injection Inject 0.3 mg into the muscle as needed for anaphylaxis. 04/22/22   Alvira Monday, MD  famotidine (PEPCID) 20  MG tablet Take 1 tab twice daily day before and day of RUSH appt 03/27/23   Ferol Luz, MD  glucosamine-chondroitin 500-400 MG tablet Take 1 tablet by mouth daily.    [provider]  montelukast (SINGULAIR) 10 MG tablet Take 1 tab morning day before & morning of RUSH appt 03/27/23   Ferol Luz, MD  Multiple Vitamin (MULTI-VITAMINS) TABS Take by mouth.    [provider]  Omega-3 Fatty Acids (FISH OIL) 1000 MG CAPS 1 TAB DAILY 04/06/13   Wendall Stade, MD  OVER THE COUNTER MEDICATION OTC Calcium and Magnesium taking 2 tabs daily    [provider]  predniSONE (DELTASONE) 20 MG tablet Take 2 tabs morning day before & morning of RUSH appt. 03/27/23   Ferol Luz, MD      Allergies    Hydrocodone-acetaminophen, Influenza vaccines, Meloxicam, Mixed vespid venom, Motrin [ibuprofen], Nabumetone, Percocet [oxycodone-acetaminophen], and Vicodin [hydrocodone-acetaminophen]    Review of Systems   Review of Systems  Respiratory:  Positive for shortness of breath.   Skin:  Positive for rash.       Left hand and arm swelling  All other systems reviewed and are negative.   Physical Exam Updated Vital Signs BP (!) 151/68   Pulse 87   Temp 98.8 F (37.1 C) (Oral)   Resp  17   Ht 5\' 2"  (1.575 m)   Wt 75.8 kg   SpO2 98%   BMI 30.54 kg/m  Physical Exam Vitals and nursing note reviewed.  Constitutional:      Appearance: Normal appearance.  HENT:     Head: Normocephalic and atraumatic.     Right Ear: External ear normal.     Left Ear: External ear normal.     Mouth/Throat:     Mouth: Mucous membranes are moist.     Pharynx: Oropharynx is clear.  Eyes:     Extraocular Movements: Extraocular movements intact.     Conjunctiva/sclera: Conjunctivae normal.     Pupils: Pupils are equal, round, and reactive to light.  Cardiovascular:     Rate and Rhythm: Normal rate and regular rhythm.     Pulses: Normal pulses.     Heart sounds: Normal heart sounds.   Pulmonary:     Effort: Pulmonary effort is normal.     Breath sounds: Normal breath sounds.  Abdominal:     General: Abdomen is flat. Bowel sounds are normal.     Palpations: Abdomen is soft.  Musculoskeletal:        General: Normal range of motion.     Cervical back: Normal range of motion and neck supple.  Skin:    General: Skin is warm.     Capillary Refill: Capillary refill takes less than 2 seconds.     Comments: Hives on abd Left arm swelling and redness.  See picture.  Neurological:     General: No focal deficit present.     Mental Status: She is alert and oriented to person, place, and time.  Psychiatric:        Mood and Affect: Mood normal.        Behavior: Behavior normal.        ED Results / Procedures / Treatments   Labs (all labs ordered are listed, but only abnormal results are displayed) Labs Reviewed  BASIC METABOLIC PANEL - Abnormal; Notable for the following components:      Result Value   Glucose, Bld 205 (*)    All other components within normal limits  CBC - Abnormal; Notable for the following components:   Platelets 633 (*)    All other components within normal limits    EKG None  Radiology No results found.  Procedures Procedures    Medications Ordered in ED Medications  sodium chloride 0.9 % bolus 1,000 mL ( Intravenous Stopped 04/08/23 1154)    And  0.9 %  sodium chloride infusion ( Intravenous Not Given 04/08/23 1157)  diphenhydrAMINE (BENADRYL) injection 25 mg (25 mg Intravenous Given 04/08/23 1045)  methylPREDNISolone sodium succinate (SOLU-MEDROL) 125 mg/2 mL injection 125 mg (125 mg Intravenous Given 04/08/23 1044)  famotidine (PEPCID) IVPB 20 mg premix (0 mg Intravenous Stopped 04/08/23 1123)    ED Course/ Medical Decision Making/ A&P                             Medical Decision Making Amount and/or Complexity of Data Reviewed Labs: ordered.  Risk Prescription drug management.   This patient presents to the ED for concern  of allergic rxn, this involves an extensive number of treatment options, and is a complaint that carries with it a high risk of complications and morbidity.  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  The pertinent results include:  cbc nl  Cardiac Monitoring:  The patient was maintained on a cardiac monitor.  I personally viewed and interpreted the cardiac monitored which showed an underlying rhythm of: nsr   Medicines ordered and prescription drug management:  I ordered medication including benadryl, solumedrol, pepcid  for sx  Reevaluation of the patient after these medicines showed that the patient improved I have reviewed the patients home medicines and have made adjustments as needed  Critical Interventions:  Solumedrol/pepcid/benadryl  Problem List / ED Course:  Anaphylaxis:  sx last night sound like anaphylaxis.  She has improved this am, but still has a large/local rxn to the left arm.  She has hives.  Hives are better after meds and swelling has decreased.  Pt is not sob here.  She is stable for d/c.  Return if worse. Hyperglycemia:  bs is elevated today.  This is likely due to prednisone.  Pt is encouraged to eat a low carbohydrate, high fiber/high protein diet. Pt is to get her bs rechecked bs next week with pcp.   Reevaluation:  After the interventions noted above, I reevaluated the patient and found that they have :improved   Social Determinants of Health:  Lives at home   Dispostion:  After consideration of the diagnostic results and the patients response to treatment, I feel  that the patent would benefit from discharge with outpatient f/u.          Final Clinical Impression(s) / ED Diagnoses Final diagnoses:  Anaphylaxis, initial encounter  Hyperglycemia    Rx / DC Orders ED Discharge Orders          Ordered    EPINEPHrine 0.3 mg/0.3 mL IJ SOAJ injection  As needed        04/08/23 1152    predniSONE (DELTASONE) 50 MG tablet  Daily with breakfast        04/08/23 1152              Jacalyn Lefevre, MD 04/08/23 1200

## 2023-04-09 ENCOUNTER — Ambulatory Visit: Payer: Medicare Other | Admitting: Internal Medicine

## 2023-04-09 NOTE — Telephone Encounter (Signed)
Recommend continuing to take zyrtec, pepcid and singulair until swelling resolves.  With standard build up. She will only need to take zyrtec on her following injection days.  But for right now stay on all 3 medications.

## 2023-04-11 ENCOUNTER — Ambulatory Visit: Payer: Medicare Other

## 2023-04-11 ENCOUNTER — Encounter: Payer: Self-pay | Admitting: Orthopaedic Surgery

## 2023-04-11 ENCOUNTER — Ambulatory Visit (INDEPENDENT_AMBULATORY_CARE_PROVIDER_SITE_OTHER): Payer: Medicare Other | Admitting: Orthopaedic Surgery

## 2023-04-11 ENCOUNTER — Other Ambulatory Visit: Payer: Medicare Other

## 2023-04-11 DIAGNOSIS — M1711 Unilateral primary osteoarthritis, right knee: Secondary | ICD-10-CM | POA: Diagnosis not present

## 2023-04-11 DIAGNOSIS — M25561 Pain in right knee: Secondary | ICD-10-CM

## 2023-04-11 DIAGNOSIS — G8929 Other chronic pain: Secondary | ICD-10-CM | POA: Diagnosis not present

## 2023-04-11 NOTE — Progress Notes (Signed)
The patient is a 77 year old female who last year underwent a left knee arthroscopy with a partial medial meniscectomy and debridement by one of my partners.  This was done after the failure conservative treatment and a MRI that showed only thinning of the cartilage in her knee with signal changes in the medial meniscus and a parameniscal cyst.  She was taken to the operating room and had an arthroscopic intervention.  The op note says that there was grade 3 to grade 4 changes on that medial compartment.  The patient has never done well since surgery in terms of the pain that she continues to have in her knee in spite of conservative treatment for a long period of time.  A more recent MRI of her left knee shows significant subchondral edema in the medial compartment the knee with areas of full-thickness cartilage loss in the weightbearing surface of the medial compartment.  The ACL and PCL are intact.  There is no cartilage wear at the patellofemoral joint or the lateral compartment of the knee and the lateral meniscus is intact.  On my exam today her pain is all focused to the medial aspect of her right knee.  The remainder of her knee exam is entirely normal.  She has good range of motion and the knee is ligamentously stable.  There is no lateral tenderness and no patellofemoral crepitance.  The pain is again significant along the medial joint line.  I did go over the MRI findings with her.  I showed her a knee replacement model and a partial knee replacement model.  I truly believe that she is a candidate for a partial knee replacement with a unicompartmental medial compartment prosthesis.  I explained this to her in detail and she is interested in learning more about a partial knee replacement.  I would like to send her to my colleague Dr. Teryl Lucy with [facility] as referral discussed this further.  She agrees with this referral so we will be in touch with making this happen.

## 2023-04-12 ENCOUNTER — Encounter: Payer: Self-pay | Admitting: Orthopaedic Surgery

## 2023-04-12 DIAGNOSIS — G8929 Other chronic pain: Secondary | ICD-10-CM

## 2023-04-16 ENCOUNTER — Ambulatory Visit: Payer: Medicare Other | Admitting: Internal Medicine

## 2023-04-16 ENCOUNTER — Ambulatory Visit: Payer: Medicare Other

## 2023-04-17 ENCOUNTER — Other Ambulatory Visit: Payer: Self-pay | Admitting: Family Medicine

## 2023-04-17 ENCOUNTER — Other Ambulatory Visit: Payer: Self-pay

## 2023-04-17 DIAGNOSIS — I10 Essential (primary) hypertension: Secondary | ICD-10-CM

## 2023-04-17 DIAGNOSIS — G8929 Other chronic pain: Secondary | ICD-10-CM

## 2023-04-18 ENCOUNTER — Encounter: Payer: Self-pay | Admitting: Orthopaedic Surgery

## 2023-04-26 ENCOUNTER — Encounter: Payer: Self-pay | Admitting: Family Medicine

## 2023-04-26 DIAGNOSIS — M1711 Unilateral primary osteoarthritis, right knee: Secondary | ICD-10-CM | POA: Diagnosis not present

## 2023-05-07 ENCOUNTER — Encounter: Payer: Self-pay | Admitting: Family Medicine

## 2023-05-07 NOTE — Telephone Encounter (Signed)
Have you come across this? I have not. I can call for them to fax this if need be.
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
## 2023-05-07 NOTE — Telephone Encounter (Signed)
Called and lvm on Sherry (scheduler) machine to call back and/or send the clearance form over so we can get that completed.

## 2023-05-08 ENCOUNTER — Ambulatory Visit (INDEPENDENT_AMBULATORY_CARE_PROVIDER_SITE_OTHER): Payer: Medicare Other | Admitting: Family Medicine

## 2023-05-08 ENCOUNTER — Encounter: Payer: Self-pay | Admitting: Family Medicine

## 2023-05-08 VITALS — BP 132/78 | HR 81 | Temp 98.0°F | Resp 18 | Ht 62.0 in | Wt 165.2 lb

## 2023-05-08 DIAGNOSIS — Z01810 Encounter for preprocedural cardiovascular examination: Secondary | ICD-10-CM

## 2023-05-08 DIAGNOSIS — N289 Disorder of kidney and ureter, unspecified: Secondary | ICD-10-CM

## 2023-05-08 DIAGNOSIS — B078 Other viral warts: Secondary | ICD-10-CM

## 2023-05-08 LAB — HEPATIC FUNCTION PANEL
ALT: 17 U/L (ref 0–35)
AST: 19 U/L (ref 0–37)
Albumin: 4.4 g/dL (ref 3.5–5.2)
Alkaline Phosphatase: 60 U/L (ref 39–117)
Bilirubin, Direct: 0.1 mg/dL (ref 0.0–0.3)
Total Bilirubin: 0.5 mg/dL (ref 0.2–1.2)
Total Protein: 6.5 g/dL (ref 6.0–8.3)

## 2023-05-08 LAB — HEMOGLOBIN A1C: Hgb A1c MFr Bld: 6.2 % (ref 4.6–6.5)

## 2023-05-08 NOTE — Patient Instructions (Addendum)
It was good to see you - we will be in touch with your labs and will get your pre-operative form taken care of We will have you see dermatology for the warts on your hands  I will be in touch with your labs asap  Let me know if you need the steroid ear drops

## 2023-05-21 ENCOUNTER — Encounter: Payer: Self-pay | Admitting: Family Medicine

## 2023-06-10 DIAGNOSIS — M25561 Pain in right knee: Secondary | ICD-10-CM | POA: Diagnosis not present

## 2023-06-10 DIAGNOSIS — M1711 Unilateral primary osteoarthritis, right knee: Secondary | ICD-10-CM | POA: Diagnosis not present

## 2023-06-10 DIAGNOSIS — Z01812 Encounter for preprocedural laboratory examination: Secondary | ICD-10-CM | POA: Diagnosis not present

## 2023-06-27 DIAGNOSIS — M948X6 Other specified disorders of cartilage, lower leg: Secondary | ICD-10-CM | POA: Diagnosis not present

## 2023-06-27 DIAGNOSIS — G8918 Other acute postprocedural pain: Secondary | ICD-10-CM | POA: Diagnosis not present

## 2023-06-27 DIAGNOSIS — M1711 Unilateral primary osteoarthritis, right knee: Secondary | ICD-10-CM | POA: Diagnosis not present

## 2023-07-04 DIAGNOSIS — M25561 Pain in right knee: Secondary | ICD-10-CM | POA: Diagnosis not present

## 2023-07-04 DIAGNOSIS — M1711 Unilateral primary osteoarthritis, right knee: Secondary | ICD-10-CM | POA: Diagnosis not present

## 2023-07-12 DIAGNOSIS — M1711 Unilateral primary osteoarthritis, right knee: Secondary | ICD-10-CM | POA: Diagnosis not present

## 2023-07-13 ENCOUNTER — Other Ambulatory Visit: Payer: Self-pay | Admitting: Family Medicine

## 2023-07-13 DIAGNOSIS — I10 Essential (primary) hypertension: Secondary | ICD-10-CM

## 2023-07-17 DIAGNOSIS — M1711 Unilateral primary osteoarthritis, right knee: Secondary | ICD-10-CM | POA: Diagnosis not present

## 2023-07-17 DIAGNOSIS — M25561 Pain in right knee: Secondary | ICD-10-CM | POA: Diagnosis not present

## 2023-07-24 DIAGNOSIS — M25561 Pain in right knee: Secondary | ICD-10-CM | POA: Diagnosis not present

## 2023-07-24 DIAGNOSIS — M1711 Unilateral primary osteoarthritis, right knee: Secondary | ICD-10-CM | POA: Diagnosis not present

## 2023-07-31 DIAGNOSIS — M1711 Unilateral primary osteoarthritis, right knee: Secondary | ICD-10-CM | POA: Diagnosis not present

## 2023-07-31 DIAGNOSIS — M25561 Pain in right knee: Secondary | ICD-10-CM | POA: Diagnosis not present

## 2023-08-07 DIAGNOSIS — M1711 Unilateral primary osteoarthritis, right knee: Secondary | ICD-10-CM | POA: Diagnosis not present

## 2023-08-07 DIAGNOSIS — M25571 Pain in right ankle and joints of right foot: Secondary | ICD-10-CM | POA: Diagnosis not present

## 2023-08-07 DIAGNOSIS — M25561 Pain in right knee: Secondary | ICD-10-CM | POA: Diagnosis not present

## 2023-08-14 ENCOUNTER — Other Ambulatory Visit (HOSPITAL_BASED_OUTPATIENT_CLINIC_OR_DEPARTMENT_OTHER): Payer: Self-pay

## 2023-08-14 ENCOUNTER — Ambulatory Visit (INDEPENDENT_AMBULATORY_CARE_PROVIDER_SITE_OTHER): Payer: Medicare Other | Admitting: Family Medicine

## 2023-08-14 ENCOUNTER — Encounter: Payer: Self-pay | Admitting: Family Medicine

## 2023-08-14 VITALS — BP 150/75 | HR 76 | Temp 97.9°F | Resp 18 | Ht 62.0 in | Wt 165.0 lb

## 2023-08-14 DIAGNOSIS — M79671 Pain in right foot: Secondary | ICD-10-CM

## 2023-08-14 MED ORDER — GABAPENTIN 300 MG PO CAPS
300.0000 mg | ORAL_CAPSULE | Freq: Three times a day (TID) | ORAL | 3 refills | Status: DC
Start: 2023-08-14 — End: 2023-11-18
  Filled 2023-08-14: qty 90, 30d supply, fill #0
  Filled 2023-09-09: qty 90, 30d supply, fill #1
  Filled 2023-10-21: qty 90, 30d supply, fill #2

## 2023-08-14 NOTE — Patient Instructions (Addendum)
let's increase your gabapentin to 300 mg three times a day- build up to this dose over the course of a few days Let me know how this seems to be working for you I think you have plantar fascitis   Ice massage, can stretches, stretching before you get up in the morning can help You can also purchase a brace to wear at night on [facility] Your BP is elevated today- likely due to pain.  However, if you can please check your BP at home a few times over the next couple of weeks and let me know how it looks!
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
## 2023-08-22 ENCOUNTER — Encounter: Payer: Self-pay | Admitting: Family Medicine

## 2023-09-04 DIAGNOSIS — M25561 Pain in right knee: Secondary | ICD-10-CM | POA: Diagnosis not present

## 2023-09-04 DIAGNOSIS — M1711 Unilateral primary osteoarthritis, right knee: Secondary | ICD-10-CM | POA: Diagnosis not present

## 2023-09-05 ENCOUNTER — Encounter: Payer: Self-pay | Admitting: Family Medicine

## 2023-09-09 DIAGNOSIS — H25813 Combined forms of age-related cataract, bilateral: Secondary | ICD-10-CM | POA: Diagnosis not present

## 2023-09-11 DIAGNOSIS — M1711 Unilateral primary osteoarthritis, right knee: Secondary | ICD-10-CM | POA: Diagnosis not present

## 2023-10-09 ENCOUNTER — Other Ambulatory Visit: Payer: Self-pay | Admitting: Family Medicine

## 2023-10-09 DIAGNOSIS — I10 Essential (primary) hypertension: Secondary | ICD-10-CM

## 2023-10-11 DIAGNOSIS — M5432 Sciatica, left side: Secondary | ICD-10-CM | POA: Diagnosis not present

## 2023-10-14 DIAGNOSIS — M79671 Pain in right foot: Secondary | ICD-10-CM | POA: Diagnosis not present

## 2023-10-14 DIAGNOSIS — M25571 Pain in right ankle and joints of right foot: Secondary | ICD-10-CM | POA: Diagnosis not present

## 2023-10-19 DIAGNOSIS — M79671 Pain in right foot: Secondary | ICD-10-CM | POA: Diagnosis not present

## 2023-10-19 DIAGNOSIS — M25571 Pain in right ankle and joints of right foot: Secondary | ICD-10-CM | POA: Diagnosis not present

## 2023-10-22 ENCOUNTER — Encounter: Payer: Self-pay | Admitting: Family Medicine

## 2023-10-25 DIAGNOSIS — M25571 Pain in right ankle and joints of right foot: Secondary | ICD-10-CM | POA: Diagnosis not present
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
## 2023-10-28 ENCOUNTER — Encounter: Payer: Self-pay | Admitting: Family Medicine

## 2023-10-28 ENCOUNTER — Ambulatory Visit (HOSPITAL_BASED_OUTPATIENT_CLINIC_OR_DEPARTMENT_OTHER)
Admission: RE | Admit: 2023-10-28 | Discharge: 2023-10-28 | Disposition: A | Discharge status: Home / Self Care | Payer: Medicare Other | Source: Ambulatory Visit | Attending: Family Medicine | Admitting: Family Medicine

## 2023-10-28 ENCOUNTER — Ambulatory Visit (INDEPENDENT_AMBULATORY_CARE_PROVIDER_SITE_OTHER): Payer: Medicare Other | Admitting: Family Medicine

## 2023-10-28 VITALS — BP 140/80 | HR 73 | Temp 97.8°F | Resp 18 | Ht 62.0 in | Wt 168.0 lb

## 2023-10-28 DIAGNOSIS — M79604 Pain in right leg: Secondary | ICD-10-CM | POA: Diagnosis not present

## 2023-10-28 DIAGNOSIS — M79671 Pain in right foot: Secondary | ICD-10-CM

## 2023-10-28 DIAGNOSIS — I10 Essential (primary) hypertension: Secondary | ICD-10-CM

## 2023-10-28 DIAGNOSIS — M79672 Pain in left foot: Secondary | ICD-10-CM | POA: Insufficient documentation

## 2023-10-28 DIAGNOSIS — M51369 Other intervertebral disc degeneration, lumbar region without mention of lumbar back pain or lower extremity pain: Secondary | ICD-10-CM | POA: Diagnosis not present

## 2023-10-28 DIAGNOSIS — M5386 Other specified dorsopathies, lumbar region: Secondary | ICD-10-CM

## 2023-10-28 DIAGNOSIS — R200 Anesthesia of skin: Secondary | ICD-10-CM | POA: Diagnosis not present

## 2023-10-28 DIAGNOSIS — M79605 Pain in left leg: Secondary | ICD-10-CM | POA: Diagnosis not present

## 2023-10-28 MED ORDER — TRAMADOL HCL 50 MG PO TABS: 50.0000 mg | ORAL_TABLET | Freq: Three times a day (TID) | ORAL | 0 refills | Status: DC | PRN

## 2023-10-28 NOTE — Patient Instructions (Signed)
I will request records from [facility] orthopedics for you Please stop by imaging on the way out and get x-rays of your lumbar spine today See if you can find your tramadol- this may help, ok to combine with tylenol Let me know if you need me to refill it for you

## 2023-10-31 MED ORDER — BENAZEPRIL-HYDROCHLOROTHIAZIDE 20-12.5 MG PO TABS: 2.0000 | ORAL_TABLET | Freq: Every day | ORAL | 3 refills | Status: DC

## 2023-10-31 NOTE — Addendum Note (Signed)
Addended by: Abbe Amsterdam C on: 10/31/2023 04:25 PM   Modules accepted: Orders

## 2023-11-03 ENCOUNTER — Ambulatory Visit (HOSPITAL_COMMUNITY)
Admission: RE | Admit: 2023-11-03 | Discharge: 2023-11-03 | Disposition: A | Discharge status: Home / Self Care | Payer: Medicare Other | Source: Ambulatory Visit | Attending: Family Medicine | Admitting: Family Medicine

## 2023-11-03 DIAGNOSIS — M5386 Other specified dorsopathies, lumbar region: Secondary | ICD-10-CM | POA: Insufficient documentation

## 2023-11-03 DIAGNOSIS — M51369 Other intervertebral disc degeneration, lumbar region without mention of lumbar back pain or lower extremity pain: Secondary | ICD-10-CM | POA: Diagnosis not present

## 2023-11-03 DIAGNOSIS — M47816 Spondylosis without myelopathy or radiculopathy, lumbar region: Secondary | ICD-10-CM | POA: Diagnosis not present

## 2023-11-03 DIAGNOSIS — M5127 Other intervertebral disc displacement, lumbosacral region: Secondary | ICD-10-CM | POA: Diagnosis not present

## 2023-11-03 DIAGNOSIS — M48061 Spinal stenosis, lumbar region without neurogenic claudication: Secondary | ICD-10-CM | POA: Diagnosis not present

## 2023-11-04 DIAGNOSIS — M25571 Pain in right ankle and joints of right foot: Secondary | ICD-10-CM | POA: Diagnosis not present

## 2023-11-04 DIAGNOSIS — M5432 Sciatica, left side: Secondary | ICD-10-CM | POA: Diagnosis not present

## 2023-11-09 ENCOUNTER — Encounter: Payer: Self-pay | Admitting: Family Medicine

## 2023-11-16 ENCOUNTER — Encounter: Payer: Self-pay | Admitting: Family Medicine

## 2023-11-16 DIAGNOSIS — M5386 Other specified dorsopathies, lumbar region: Secondary | ICD-10-CM

## 2023-11-16 DIAGNOSIS — M79671 Pain in right foot: Secondary | ICD-10-CM

## 2023-11-18 ENCOUNTER — Encounter: Payer: Self-pay | Admitting: Family Medicine

## 2023-11-18 ENCOUNTER — Other Ambulatory Visit (HOSPITAL_BASED_OUTPATIENT_CLINIC_OR_DEPARTMENT_OTHER): Payer: Self-pay

## 2023-11-18 MED ORDER — GABAPENTIN 300 MG PO CAPS
300.0000 mg | ORAL_CAPSULE | Freq: Three times a day (TID) | ORAL | 3 refills | Status: DC
Start: 2023-11-18 — End: 2023-12-14
  Filled 2023-11-18: qty 90, 30d supply, fill #0
  Filled 2023-12-12: qty 90, 30d supply, fill #1
  Filled ????-??-??: fill #1

## 2023-11-18 MED ORDER — TRAMADOL HCL 50 MG PO TABS
50.0000 mg | ORAL_TABLET | Freq: Three times a day (TID) | ORAL | 0 refills | Status: DC | PRN
Start: 2023-11-18 — End: 2024-07-13
  Filled 2023-11-18: qty 40, 13d supply, fill #0

## 2023-11-19 ENCOUNTER — Encounter (INDEPENDENT_AMBULATORY_CARE_PROVIDER_SITE_OTHER): Payer: Medicare Other | Admitting: Family Medicine

## 2023-11-19 DIAGNOSIS — M5386 Other specified dorsopathies, lumbar region: Secondary | ICD-10-CM | POA: Diagnosis not present

## 2023-11-19 DIAGNOSIS — M1711 Unilateral primary osteoarthritis, right knee: Secondary | ICD-10-CM | POA: Diagnosis not present

## 2023-11-19 DIAGNOSIS — M25661 Stiffness of right knee, not elsewhere classified: Secondary | ICD-10-CM | POA: Diagnosis not present

## 2023-11-19 DIAGNOSIS — R269 Unspecified abnormalities of gait and mobility: Secondary | ICD-10-CM | POA: Diagnosis not present

## 2023-11-19 DIAGNOSIS — M4726 Other spondylosis with radiculopathy, lumbar region: Secondary | ICD-10-CM | POA: Diagnosis not present

## 2023-11-19 DIAGNOSIS — M6281 Muscle weakness (generalized): Secondary | ICD-10-CM | POA: Diagnosis not present

## 2023-11-20 NOTE — Telephone Encounter (Signed)
 Please see the MyChart message reply(ies) for my assessment and plan.  The patient gave consent for this Medical Advice Message and is aware that it may result in a bill to their insurance company as well as the possibility that this may result in a co-payment or deductible. They are an established patient, but are not seeking medical advice exclusively about a problem treated during an in person or video visit in the last 7 days. I did not recommend an in person or video visit within 7 days of my reply.  I spent a total of 15 minutes cumulative time within 7 days through [employer] Abbe Amsterdam, MD

## 2023-11-25 DIAGNOSIS — M4726 Other spondylosis with radiculopathy, lumbar region: Secondary | ICD-10-CM | POA: Diagnosis not present

## 2023-11-25 DIAGNOSIS — R269 Unspecified abnormalities of gait and mobility: Secondary | ICD-10-CM | POA: Diagnosis not present

## 2023-11-25 DIAGNOSIS — M25661 Stiffness of right knee, not elsewhere classified: Secondary | ICD-10-CM | POA: Diagnosis not present

## 2023-11-25 DIAGNOSIS — M6281 Muscle weakness (generalized): Secondary | ICD-10-CM | POA: Diagnosis not present

## 2023-11-25 DIAGNOSIS — M1711 Unilateral primary osteoarthritis, right knee: Secondary | ICD-10-CM | POA: Diagnosis not present

## 2023-12-02 DIAGNOSIS — M1711 Unilateral primary osteoarthritis, right knee: Secondary | ICD-10-CM | POA: Diagnosis not present

## 2023-12-02 DIAGNOSIS — R269 Unspecified abnormalities of gait and mobility: Secondary | ICD-10-CM | POA: Diagnosis not present

## 2023-12-02 DIAGNOSIS — M6281 Muscle weakness (generalized): Secondary | ICD-10-CM | POA: Diagnosis not present

## 2023-12-02 DIAGNOSIS — M25661 Stiffness of right knee, not elsewhere classified: Secondary | ICD-10-CM | POA: Diagnosis not present

## 2023-12-02 DIAGNOSIS — M4726 Other spondylosis with radiculopathy, lumbar region: Secondary | ICD-10-CM | POA: Diagnosis not present

## 2023-12-03 ENCOUNTER — Ambulatory Visit (HOSPITAL_COMMUNITY)
Admission: RE | Admit: 2023-12-03 | Discharge: 2023-12-03 | Disposition: A | Discharge status: Home / Self Care | Payer: Medicare Other | Source: Ambulatory Visit | Attending: Vascular Surgery | Admitting: Vascular Surgery

## 2023-12-03 ENCOUNTER — Other Ambulatory Visit (HOSPITAL_COMMUNITY): Payer: Self-pay | Admitting: Orthopedic Surgery

## 2023-12-03 ENCOUNTER — Encounter: Payer: Self-pay | Admitting: Family Medicine

## 2023-12-03 DIAGNOSIS — R52 Pain, unspecified: Secondary | ICD-10-CM | POA: Diagnosis not present

## 2023-12-09 ENCOUNTER — Encounter: Payer: Self-pay | Admitting: Family Medicine

## 2023-12-09 DIAGNOSIS — R269 Unspecified abnormalities of gait and mobility: Secondary | ICD-10-CM | POA: Diagnosis not present

## 2023-12-09 DIAGNOSIS — M6281 Muscle weakness (generalized): Secondary | ICD-10-CM | POA: Diagnosis not present

## 2023-12-09 DIAGNOSIS — M4726 Other spondylosis with radiculopathy, lumbar region: Secondary | ICD-10-CM | POA: Diagnosis not present

## 2023-12-09 DIAGNOSIS — M1711 Unilateral primary osteoarthritis, right knee: Secondary | ICD-10-CM | POA: Diagnosis not present

## 2023-12-09 DIAGNOSIS — M25661 Stiffness of right knee, not elsewhere classified: Secondary | ICD-10-CM | POA: Diagnosis not present

## 2023-12-11 ENCOUNTER — Encounter: Payer: Self-pay | Admitting: Family Medicine

## 2023-12-12 ENCOUNTER — Other Ambulatory Visit (HOSPITAL_BASED_OUTPATIENT_CLINIC_OR_DEPARTMENT_OTHER): Payer: Self-pay

## 2023-12-13 ENCOUNTER — Other Ambulatory Visit (HOSPITAL_BASED_OUTPATIENT_CLINIC_OR_DEPARTMENT_OTHER): Payer: Self-pay

## 2023-12-13 ENCOUNTER — Encounter: Payer: Self-pay | Admitting: Family Medicine

## 2023-12-13 DIAGNOSIS — M5386 Other specified dorsopathies, lumbar region: Secondary | ICD-10-CM

## 2023-12-14 ENCOUNTER — Other Ambulatory Visit (HOSPITAL_BASED_OUTPATIENT_CLINIC_OR_DEPARTMENT_OTHER): Payer: Self-pay

## 2023-12-14 DIAGNOSIS — M545 Low back pain, unspecified: Secondary | ICD-10-CM | POA: Diagnosis not present

## 2023-12-14 DIAGNOSIS — M79671 Pain in right foot: Secondary | ICD-10-CM | POA: Diagnosis not present

## 2023-12-14 MED ORDER — GABAPENTIN 300 MG PO CAPS
ORAL_CAPSULE | ORAL | 1 refills | Status: DC
Start: 2023-12-14 — End: 2024-04-10
  Filled 2023-12-14: qty 360, 90d supply, fill #0

## 2023-12-14 NOTE — Addendum Note (Signed)
 Addended by: Gates Kasal C on: 12/14/2023 07:15 AM   Modules accepted: Orders

## 2023-12-15 ENCOUNTER — Other Ambulatory Visit: Payer: Self-pay

## 2023-12-16 ENCOUNTER — Other Ambulatory Visit (HOSPITAL_BASED_OUTPATIENT_CLINIC_OR_DEPARTMENT_OTHER): Payer: Self-pay

## 2023-12-16 ENCOUNTER — Other Ambulatory Visit: Payer: Self-pay

## 2023-12-18 DIAGNOSIS — M25661 Stiffness of right knee, not elsewhere classified: Secondary | ICD-10-CM | POA: Diagnosis not present

## 2023-12-18 DIAGNOSIS — M4726 Other spondylosis with radiculopathy, lumbar region: Secondary | ICD-10-CM | POA: Diagnosis not present

## 2023-12-18 DIAGNOSIS — M6281 Muscle weakness (generalized): Secondary | ICD-10-CM | POA: Diagnosis not present

## 2023-12-18 DIAGNOSIS — R269 Unspecified abnormalities of gait and mobility: Secondary | ICD-10-CM | POA: Diagnosis not present

## 2023-12-18 DIAGNOSIS — M1711 Unilateral primary osteoarthritis, right knee: Secondary | ICD-10-CM | POA: Diagnosis not present

## 2024-01-01 DIAGNOSIS — M4726 Other spondylosis with radiculopathy, lumbar region: Secondary | ICD-10-CM | POA: Diagnosis not present

## 2024-01-01 DIAGNOSIS — M25661 Stiffness of right knee, not elsewhere classified: Secondary | ICD-10-CM | POA: Diagnosis not present

## 2024-01-01 DIAGNOSIS — R269 Unspecified abnormalities of gait and mobility: Secondary | ICD-10-CM | POA: Diagnosis not present

## 2024-01-01 DIAGNOSIS — M1711 Unilateral primary osteoarthritis, right knee: Secondary | ICD-10-CM | POA: Diagnosis not present

## 2024-01-01 DIAGNOSIS — M6281 Muscle weakness (generalized): Secondary | ICD-10-CM | POA: Diagnosis not present

## 2024-01-06 ENCOUNTER — Encounter: Payer: Self-pay | Admitting: Family Medicine

## 2024-01-06 DIAGNOSIS — R200 Anesthesia of skin: Secondary | ICD-10-CM | POA: Diagnosis not present

## 2024-01-06 DIAGNOSIS — M79604 Pain in right leg: Secondary | ICD-10-CM | POA: Diagnosis not present

## 2024-01-06 DIAGNOSIS — M5116 Intervertebral disc disorders with radiculopathy, lumbar region: Secondary | ICD-10-CM | POA: Diagnosis not present

## 2024-01-06 DIAGNOSIS — G8929 Other chronic pain: Secondary | ICD-10-CM | POA: Diagnosis not present

## 2024-01-06 DIAGNOSIS — I10 Essential (primary) hypertension: Secondary | ICD-10-CM

## 2024-01-07 ENCOUNTER — Other Ambulatory Visit (HOSPITAL_BASED_OUTPATIENT_CLINIC_OR_DEPARTMENT_OTHER): Payer: Self-pay

## 2024-01-07 MED ORDER — BENAZEPRIL-HYDROCHLOROTHIAZIDE 20-12.5 MG PO TABS
2.0000 | ORAL_TABLET | Freq: Every day | ORAL | 3 refills | Status: AC
  Filled 2024-01-07: qty 180, 90d supply, fill #0
  Filled 2024-04-14: qty 180, 90d supply, fill #1
  Filled 2024-07-18: qty 180, 90d supply, fill #2
  Filled 2024-10-14: qty 180, 90d supply, fill #3

## 2024-01-10 ENCOUNTER — Other Ambulatory Visit (HOSPITAL_BASED_OUTPATIENT_CLINIC_OR_DEPARTMENT_OTHER): Payer: Self-pay

## 2024-01-13 ENCOUNTER — Other Ambulatory Visit (HOSPITAL_BASED_OUTPATIENT_CLINIC_OR_DEPARTMENT_OTHER): Payer: Self-pay

## 2024-01-13 ENCOUNTER — Encounter (HOSPITAL_BASED_OUTPATIENT_CLINIC_OR_DEPARTMENT_OTHER): Payer: Self-pay

## 2024-01-16 DIAGNOSIS — R269 Unspecified abnormalities of gait and mobility: Secondary | ICD-10-CM | POA: Diagnosis not present

## 2024-01-16 DIAGNOSIS — M4726 Other spondylosis with radiculopathy, lumbar region: Secondary | ICD-10-CM | POA: Diagnosis not present

## 2024-01-16 DIAGNOSIS — M25661 Stiffness of right knee, not elsewhere classified: Secondary | ICD-10-CM | POA: Diagnosis not present

## 2024-01-16 DIAGNOSIS — M1711 Unilateral primary osteoarthritis, right knee: Secondary | ICD-10-CM | POA: Diagnosis not present

## 2024-01-16 DIAGNOSIS — M6281 Muscle weakness (generalized): Secondary | ICD-10-CM | POA: Diagnosis not present

## 2024-01-23 DIAGNOSIS — M25661 Stiffness of right knee, not elsewhere classified: Secondary | ICD-10-CM | POA: Diagnosis not present

## 2024-01-23 DIAGNOSIS — M4726 Other spondylosis with radiculopathy, lumbar region: Secondary | ICD-10-CM | POA: Diagnosis not present

## 2024-01-23 DIAGNOSIS — M1711 Unilateral primary osteoarthritis, right knee: Secondary | ICD-10-CM | POA: Diagnosis not present

## 2024-01-23 DIAGNOSIS — M6281 Muscle weakness (generalized): Secondary | ICD-10-CM | POA: Diagnosis not present

## 2024-01-23 DIAGNOSIS — R269 Unspecified abnormalities of gait and mobility: Secondary | ICD-10-CM | POA: Diagnosis not present

## 2024-02-03 ENCOUNTER — Other Ambulatory Visit: Payer: Self-pay

## 2024-02-03 ENCOUNTER — Telehealth: Payer: Self-pay

## 2024-02-03 NOTE — Telephone Encounter (Signed)
 We would have to start over given it has been a year.  We could repeat rush or standard build up it is her choice.

## 2024-02-03 NOTE — Telephone Encounter (Signed)
 Pt called wanting to restart back venom shots, she had started VIT RUSH and but had to switch to traditional due to her being stung. After that period she had knee surgery with complications and couldn't continue on the VIT traditional build up, but she's ready now. Can you advise on her step, and we can try to get her schedule. Thank you.

## 2024-02-06 ENCOUNTER — Other Ambulatory Visit: Payer: Self-pay

## 2024-02-06 ENCOUNTER — Ambulatory Visit (INDEPENDENT_AMBULATORY_CARE_PROVIDER_SITE_OTHER)

## 2024-02-06 DIAGNOSIS — T63441D Toxic effect of venom of bees, accidental (unintentional), subsequent encounter: Secondary | ICD-10-CM

## 2024-02-06 NOTE — Progress Notes (Signed)
 Immunotherapy   Patient Details  Name: CORIANNE BUCCELLATO MRN: [id number] Date of Birth: [birth year]  02/06/2024  Cleotis Nipper started injections for venom (MV and HB) Following schedule: regular venom schedule  Frequency:1 time per week Epi-Pen:Epi-Pen Available  Consent signed and patient instructions given. No problem after 30 minute wait.   Ted Goodner J Exa Bomba 02/06/2024, 1:29 PM

## 2024-02-12 DIAGNOSIS — G629 Polyneuropathy, unspecified: Secondary | ICD-10-CM | POA: Diagnosis not present

## 2024-02-13 ENCOUNTER — Ambulatory Visit (INDEPENDENT_AMBULATORY_CARE_PROVIDER_SITE_OTHER)

## 2024-02-13 DIAGNOSIS — T63441D Toxic effect of venom of bees, accidental (unintentional), subsequent encounter: Secondary | ICD-10-CM

## 2024-02-20 ENCOUNTER — Ambulatory Visit (INDEPENDENT_AMBULATORY_CARE_PROVIDER_SITE_OTHER)

## 2024-02-20 ENCOUNTER — Ambulatory Visit: Payer: Self-pay

## 2024-02-20 DIAGNOSIS — T63441D Toxic effect of venom of bees, accidental (unintentional), subsequent encounter: Secondary | ICD-10-CM

## 2024-02-26 ENCOUNTER — Ambulatory Visit

## 2024-02-26 DIAGNOSIS — T63441D Toxic effect of venom of bees, accidental (unintentional), subsequent encounter: Secondary | ICD-10-CM

## 2024-03-04 ENCOUNTER — Ambulatory Visit (INDEPENDENT_AMBULATORY_CARE_PROVIDER_SITE_OTHER)

## 2024-03-04 DIAGNOSIS — T63441D Toxic effect of venom of bees, accidental (unintentional), subsequent encounter: Secondary | ICD-10-CM | POA: Diagnosis not present

## 2024-03-11 ENCOUNTER — Ambulatory Visit (INDEPENDENT_AMBULATORY_CARE_PROVIDER_SITE_OTHER)

## 2024-03-11 DIAGNOSIS — T63441D Toxic effect of venom of bees, accidental (unintentional), subsequent encounter: Secondary | ICD-10-CM | POA: Diagnosis not present

## 2024-03-18 ENCOUNTER — Ambulatory Visit (INDEPENDENT_AMBULATORY_CARE_PROVIDER_SITE_OTHER)

## 2024-03-18 DIAGNOSIS — T63441D Toxic effect of venom of bees, accidental (unintentional), subsequent encounter: Secondary | ICD-10-CM | POA: Diagnosis not present

## 2024-03-25 ENCOUNTER — Ambulatory Visit (INDEPENDENT_AMBULATORY_CARE_PROVIDER_SITE_OTHER)

## 2024-03-25 DIAGNOSIS — T63441D Toxic effect of venom of bees, accidental (unintentional), subsequent encounter: Secondary | ICD-10-CM

## 2024-04-01 ENCOUNTER — Ambulatory Visit (INDEPENDENT_AMBULATORY_CARE_PROVIDER_SITE_OTHER): Payer: Self-pay

## 2024-04-01 ENCOUNTER — Ambulatory Visit

## 2024-04-01 DIAGNOSIS — T63441D Toxic effect of venom of bees, accidental (unintentional), subsequent encounter: Secondary | ICD-10-CM

## 2024-04-07 ENCOUNTER — Encounter: Payer: Self-pay | Admitting: Family Medicine

## 2024-04-08 ENCOUNTER — Ambulatory Visit (INDEPENDENT_AMBULATORY_CARE_PROVIDER_SITE_OTHER)

## 2024-04-08 DIAGNOSIS — T63441D Toxic effect of venom of bees, accidental (unintentional), subsequent encounter: Secondary | ICD-10-CM | POA: Diagnosis not present

## 2024-04-10 ENCOUNTER — Encounter: Payer: Self-pay | Admitting: Family Medicine

## 2024-04-10 ENCOUNTER — Other Ambulatory Visit (HOSPITAL_BASED_OUTPATIENT_CLINIC_OR_DEPARTMENT_OTHER): Payer: Self-pay

## 2024-04-10 DIAGNOSIS — M5386 Other specified dorsopathies, lumbar region: Secondary | ICD-10-CM

## 2024-04-10 MED ORDER — GABAPENTIN 300 MG PO CAPS
300.0000 mg | ORAL_CAPSULE | Freq: Two times a day (BID) | ORAL | 3 refills | Status: AC
  Filled 2024-04-10: qty 163, 81d supply, fill #0
  Filled 2024-04-10: qty 180, 90d supply, fill #0
  Filled 2024-04-10: qty 17, 9d supply, fill #0
  Filled 2024-08-10: qty 180, 90d supply, fill #1
  Filled 2024-11-08: qty 180, 90d supply, fill #2

## 2024-04-14 ENCOUNTER — Other Ambulatory Visit: Payer: Self-pay

## 2024-04-14 ENCOUNTER — Other Ambulatory Visit (HOSPITAL_BASED_OUTPATIENT_CLINIC_OR_DEPARTMENT_OTHER): Payer: Self-pay

## 2024-04-15 ENCOUNTER — Encounter: Payer: Self-pay | Admitting: Family Medicine

## 2024-04-15 ENCOUNTER — Other Ambulatory Visit (HOSPITAL_BASED_OUTPATIENT_CLINIC_OR_DEPARTMENT_OTHER): Payer: Self-pay

## 2024-04-15 MED ORDER — EPINEPHRINE 0.3 MG/0.3ML IJ SOAJ
0.3000 mg | INTRAMUSCULAR | 99 refills | Status: AC | PRN
  Filled 2024-04-15: qty 2, 30d supply, fill #0

## 2024-04-16 ENCOUNTER — Other Ambulatory Visit (HOSPITAL_BASED_OUTPATIENT_CLINIC_OR_DEPARTMENT_OTHER): Payer: Self-pay

## 2024-04-16 ENCOUNTER — Ambulatory Visit

## 2024-04-16 DIAGNOSIS — T63441D Toxic effect of venom of bees, accidental (unintentional), subsequent encounter: Secondary | ICD-10-CM

## 2024-04-24 ENCOUNTER — Ambulatory Visit: Payer: Self-pay | Admitting: *Deleted

## 2024-04-24 ENCOUNTER — Telehealth: Payer: Self-pay | Admitting: *Deleted

## 2024-04-24 ENCOUNTER — Ambulatory Visit: Admitting: *Deleted

## 2024-04-24 DIAGNOSIS — T63441D Toxic effect of venom of bees, accidental (unintentional), subsequent encounter: Secondary | ICD-10-CM | POA: Diagnosis not present

## 2024-04-24 NOTE — Telephone Encounter (Signed)
 Lindsey Shepard came in for venom injections today and she says with her 2 previous injections she has waited the 30 minutes in clinic and then when she is driving home she feels out of it and spacey and its hard for her to stay awake. She usually takes Xyzal before she comes. This time she took Benadryl  before and she had her daughter drive her. I suggested she use a non drowsy antihistamine such as Allegra or Claritin to see if this helps. She says if this keep happening then she may have to stop the shots. Routing to Dr Jolayne Natter as an Arlie Lain.

## 2024-04-24 NOTE — Telephone Encounter (Signed)
 I recommend a longer observation period (maybe 45 minutes) so we can get vitals and an exam if she gets these symptoms.

## 2024-04-30 ENCOUNTER — Ambulatory Visit

## 2024-04-30 DIAGNOSIS — T63441D Toxic effect of venom of bees, accidental (unintentional), subsequent encounter: Secondary | ICD-10-CM

## 2024-05-01 DIAGNOSIS — H903 Sensorineural hearing loss, bilateral: Secondary | ICD-10-CM | POA: Diagnosis not present

## 2024-05-04 NOTE — Telephone Encounter (Signed)
 Spoke with Rock and she says she did not have any trouble with her last shot. She took claritin and it didn't help with her sneezing. I suggested she take Allegra to try next. I informed her of Dr Carita suggestion.

## 2024-05-06 ENCOUNTER — Ambulatory Visit (INDEPENDENT_AMBULATORY_CARE_PROVIDER_SITE_OTHER)

## 2024-05-06 DIAGNOSIS — T63441D Toxic effect of venom of bees, accidental (unintentional), subsequent encounter: Secondary | ICD-10-CM | POA: Diagnosis not present

## 2024-05-14 ENCOUNTER — Ambulatory Visit

## 2024-05-14 DIAGNOSIS — T63441D Toxic effect of venom of bees, accidental (unintentional), subsequent encounter: Secondary | ICD-10-CM | POA: Diagnosis not present

## 2024-05-20 ENCOUNTER — Ambulatory Visit (INDEPENDENT_AMBULATORY_CARE_PROVIDER_SITE_OTHER)

## 2024-05-20 DIAGNOSIS — T63441D Toxic effect of venom of bees, accidental (unintentional), subsequent encounter: Secondary | ICD-10-CM | POA: Diagnosis not present

## 2024-05-26 ENCOUNTER — Ambulatory Visit (INDEPENDENT_AMBULATORY_CARE_PROVIDER_SITE_OTHER): Admitting: Dermatology

## 2024-05-26 ENCOUNTER — Other Ambulatory Visit (HOSPITAL_BASED_OUTPATIENT_CLINIC_OR_DEPARTMENT_OTHER): Payer: Self-pay

## 2024-05-26 ENCOUNTER — Encounter: Payer: Self-pay | Admitting: Dermatology

## 2024-05-26 VITALS — BP 125/83 | HR 80

## 2024-05-26 DIAGNOSIS — I7300 Raynaud's syndrome without gangrene: Secondary | ICD-10-CM | POA: Diagnosis not present

## 2024-05-26 DIAGNOSIS — B079 Viral wart, unspecified: Secondary | ICD-10-CM | POA: Diagnosis not present

## 2024-05-26 DIAGNOSIS — Z719 Counseling, unspecified: Secondary | ICD-10-CM

## 2024-05-26 MED ORDER — FLUOROURACIL 5 % EX CREA
TOPICAL_CREAM | Freq: Two times a day (BID) | CUTANEOUS | 1 refills | Status: AC
  Filled 2024-05-26: qty 40, 30d supply, fill #0

## 2024-05-26 NOTE — Patient Instructions (Addendum)
 Apply fluorouracil  nightly then cover  Raynaud's phenomenon, also known as Raynaud's syndrome or Raynaud's disease, is a condition that causes reduced blood flow to the extremities, typically the fingers and toes, in response to cold temperatures or stress. This reduced blood flow leads to a characteristic color change, usually from white to blue and then red, as well as numbness, tingling, and pain in the affected areas.    Important Information   Due to recent changes in healthcare laws, you may see results of your pathology and/or laboratory studies on MyChart before the doctors have had a chance to review them. We understand that in some cases there may be results that are confusing or concerning to you. Please understand that not all results are received at the same time and often the doctors may need to interpret multiple results in order to provide you with the best plan of care or course of treatment. Therefore, we ask that you please give us  2 business days to thoroughly review all your results before contacting the office for clarification. Should we see a critical lab result, you will be contacted sooner.     If You Need Anything After Your Visit   If you have any questions or concerns for your doctor, please call our main line at [phone]. If no one answers, please leave a voicemail as directed and we will return your call as soon as possible. Messages left after 4 pm will be answered the following business day.    You may also send us  a message via MyChart. We typically respond to MyChart messages within 1-2 business days.  For prescription refills, please ask your pharmacy to contact our office. Our fax number is [phone].  If you have an urgent issue when the clinic is closed that cannot wait until the next business day, you can page your doctor at the number below.     Please note that while we do our best to be available for urgent issues outside of office hours, we are  not available 24/7.    If you have an urgent issue and are unable to reach us , you may choose to seek medical care at your doctor's office, retail clinic, urgent care center, or emergency room.   If you have a medical emergency, please immediately call 911 or go to the emergency department. In the event of inclement weather, please call our main line at [phone] for an update on the status of any delays or closures.  Dermatology Medication Tips: Please keep the boxes that topical medications come in in order to help keep track of the instructions about where and how to use these. Pharmacies typically print the medication instructions only on the boxes and not directly on the medication tubes.   If your medication is too expensive, please contact our office at [phone] or send us  a message through MyChart.    We are unable to tell what your co-pay for medications will be in advance as this is different depending on your insurance coverage. However, we may be able to find a substitute medication at lower cost or fill out paperwork to get insurance to cover a needed medication.    If a prior authorization is required to get your medication covered by your insurance company, please allow us  1-2 business days to complete this process.   Drug prices often vary depending on where the prescription is filled and some pharmacies may offer cheaper prices.   The website www.goodrx.com contains coupons  for medications through different pharmacies. The prices here do not account for what the cost may be with help from insurance (it may be cheaper with your insurance), but the website can give you the price if you did not use any insurance.  - You can print the associated coupon and take it with your prescription to the pharmacy.  - You may also stop by our office during regular business hours and pick up a GoodRx coupon card.  - If you need your prescription sent electronically to a different  pharmacy, notify our office through [facility] or by phone at [phone]

## 2024-05-26 NOTE — Progress Notes (Signed)
   New Patient Visit   Subjective  Lindsey Shepard is a 78 y.o. female who presents for the following: warts  Pt has warts on bilateral hands that have been treated in the past with liquid nitrogen. They didn't improve until recently. Pt started a venom clinic around 3 months ago where she gets weekly injections and it has started improving for bee venom allergies.   She has also been experiencing burning of her fingers after exposure to extreme temperatures. She felt like the cryo with her PCP on her fingers triggered a flare.   The following portions of the chart were reviewed this encounter and updated as appropriate: medications, allergies, medical history  Review of Systems:  No other skin or systemic complaints except as noted in HPI or Assessment and Plan.  Objective  Well appearing patient in no apparent distress; mood and affect are within normal limits.  A focused examination was performed of the following areas: hands  Relevant exam findings are noted in the Assessment and Plan.  left finger x 1, right hand x 12 Verrucous papules   Assessment & Plan   Raynaud's Phenomenon Patient was counseled regarding Raynaud's syndrome, a condition characterized by episodic vasospasm of the small arteries, typically affecting the fingers and toes, and often triggered by cold exposure or emotional stress. Discussed the importance of avoiding known triggers such as cold environments, handling frozen items without gloves, and emotional stress when possible. Recommended protective measures including wearing warm gloves, especially in cold weather or when using air conditioning, and layering clothing to maintain core body temperature. Also advised on smoking cessation if applicable, as nicotine can exacerbate vasospasm. Treatment options were reviewed, including conservative strategies such as warming techniques and stress management, and pharmacologic options such as calcium channel blockers  (e.g., nifedipine) for more severe or frequent episodes. Patient was encouraged to monitor for any signs of ulceration, color changes beyond the typical white-blue-red pattern, or persistent symptoms, which may warrant further evaluation for secondary causes such as connective tissue disease. Patient verbalized understanding and agreed with the plan. VIRAL WARTS, UNSPECIFIED TYPE left finger x 1, right hand x 12 Destruction of lesion - left finger x 1, right hand x 12 Complexity: simple   Destruction method: cryotherapy   Informed consent: discussed and consent obtained   Timeout:  patient name, date of birth, surgical site, and procedure verified Outcome: patient tolerated procedure well with no complications   Post-procedure details: wound care instructions given    RAYNAUD'S DISEASE WITHOUT GANGRENE   Apply 5 fu to warts every night then cover  WART Exam: verrucous papule(s)  Counseling Discussed viral / HPV (Human Papilloma Virus) etiology and risk of spread /infectivity to other areas of body as well as to other people.  Multiple treatments and methods may be required to clear warts and it is possible treatment may not be successful.  Treatment risks include discoloration; scarring and there is still potential for wart recurrence.  Treatment Plan: Paring down large one and follow with cryotherapy Will Rx 7fu topical, to be used nightly under occlusion   Return in about 4 weeks (around 06/23/2024) for WART F/U.  I, Darice Smock, CMA, am acting as scribe for RUFUS CHRISTELLA HOLY, MD.   Documentation: I have reviewed the above documentation for accuracy and completeness, and I agree with the above.  RUFUS CHRISTELLA HOLY, MD

## 2024-05-27 ENCOUNTER — Ambulatory Visit (INDEPENDENT_AMBULATORY_CARE_PROVIDER_SITE_OTHER)

## 2024-05-27 DIAGNOSIS — T63441D Toxic effect of venom of bees, accidental (unintentional), subsequent encounter: Secondary | ICD-10-CM

## 2024-06-03 ENCOUNTER — Ambulatory Visit (INDEPENDENT_AMBULATORY_CARE_PROVIDER_SITE_OTHER)

## 2024-06-03 DIAGNOSIS — T63441D Toxic effect of venom of bees, accidental (unintentional), subsequent encounter: Secondary | ICD-10-CM

## 2024-06-10 ENCOUNTER — Ambulatory Visit (INDEPENDENT_AMBULATORY_CARE_PROVIDER_SITE_OTHER)

## 2024-06-10 DIAGNOSIS — T63441D Toxic effect of venom of bees, accidental (unintentional), subsequent encounter: Secondary | ICD-10-CM

## 2024-06-17 ENCOUNTER — Ambulatory Visit

## 2024-06-17 DIAGNOSIS — T63441D Toxic effect of venom of bees, accidental (unintentional), subsequent encounter: Secondary | ICD-10-CM

## 2024-06-24 ENCOUNTER — Other Ambulatory Visit (HOSPITAL_BASED_OUTPATIENT_CLINIC_OR_DEPARTMENT_OTHER): Payer: Self-pay

## 2024-06-24 ENCOUNTER — Ambulatory Visit (INDEPENDENT_AMBULATORY_CARE_PROVIDER_SITE_OTHER)

## 2024-06-24 DIAGNOSIS — T63441D Toxic effect of venom of bees, accidental (unintentional), subsequent encounter: Secondary | ICD-10-CM

## 2024-06-26 ENCOUNTER — Encounter: Payer: Self-pay | Admitting: Family Medicine

## 2024-07-01 ENCOUNTER — Ambulatory Visit (INDEPENDENT_AMBULATORY_CARE_PROVIDER_SITE_OTHER)

## 2024-07-01 DIAGNOSIS — T63441D Toxic effect of venom of bees, accidental (unintentional), subsequent encounter: Secondary | ICD-10-CM

## 2024-07-02 ENCOUNTER — Ambulatory Visit: Admitting: Dermatology

## 2024-07-02 ENCOUNTER — Encounter: Payer: Self-pay | Admitting: Dermatology

## 2024-07-02 VITALS — BP 168/93 | HR 67

## 2024-07-02 DIAGNOSIS — B079 Viral wart, unspecified: Secondary | ICD-10-CM | POA: Diagnosis not present

## 2024-07-02 DIAGNOSIS — B078 Other viral warts: Secondary | ICD-10-CM

## 2024-07-02 DIAGNOSIS — I7300 Raynaud's syndrome without gangrene: Secondary | ICD-10-CM

## 2024-07-02 NOTE — Patient Instructions (Signed)

## 2024-07-02 NOTE — Progress Notes (Signed)
   Follow-Up Visit   Subjective  Lindsey Shepard is a 78 y.o. female who presents for the following: wart follow up  Patient present today for follow up visit for warts. Patient was last evaluated on 05/26/2024. At this visit patient was prescribed efudex . Patient reports sxs are better.   The following portions of the chart were reviewed this encounter and updated as appropriate: medications, allergies, medical history  Review of Systems:  No other skin or systemic complaints except as noted in HPI or Assessment and Plan.  Objective  Well appearing patient in no apparent distress; mood and affect are within normal limits.  A focused examination was performed of the following areas: hands  Relevant exam findings are noted in the Assessment and Plan.  Left Proximal 2nd Finger, Right 4th Finger Proximal Interphalangeal Joint Verrucous papules   Assessment & Plan   Raynaud's Phenomenon Patient was counseled regarding Raynaud's syndrome, a condition characterized by episodic vasospasm of the small arteries, typically affecting the fingers and toes, and often triggered by cold exposure or emotional stress. Discussed the importance of avoiding known triggers such as cold environments, handling frozen items without gloves, and emotional stress when possible. Recommended protective measures including wearing warm gloves, especially in cold weather or when using air conditioning, and layering clothing to maintain core body temperature. Also advised on smoking cessation if applicable, as nicotine can exacerbate vasospasm. Treatment options were reviewed, including conservative strategies such as warming techniques and stress management, and pharmacologic options such as calcium channel blockers (e.g., nifedipine) for more severe or frequent episodes. Patient was encouraged to monitor for any signs of ulceration, color changes beyond the typical white-blue-red pattern, or persistent symptoms, which may  warrant further evaluation for secondary causes such as connective tissue disease. Patient verbalized understanding and agreed with the plan.  WART Exam: verrucous papule(s)  Counseling Discussed viral / HPV (Human Papilloma Virus) etiology and risk of spread /infectivity to other areas of body as well as to other people.  Multiple treatments and methods may be required to clear warts and it is possible treatment may not be successful.  Treatment risks include discoloration; scarring and there is still potential for wart recurrence.  Treatment Plan: Cryotherapy to the left 2nd finger and the right 4th finger. Continue Rx 5FU topical, to be used nightly under occlusion  OTHER VIRAL WARTS (2) Left Proximal 2nd Finger, Right 4th Finger Proximal Interphalangeal Joint Destruction of lesion - Left Proximal 2nd Finger, Right 4th Finger Proximal Interphalangeal Joint Complexity: simple   Destruction method: cryotherapy   Informed consent: discussed and consent obtained   Timeout:  patient name, date of birth, surgical site, and procedure verified Lesion destroyed using liquid nitrogen: Yes   Region frozen until ice ball extended beyond lesion: Yes   Outcome: patient tolerated procedure well with no complications   Post-procedure details: wound care instructions given    RAYNAUD'S DISEASE WITHOUT GANGRENE   VIRAL WARTS, UNSPECIFIED TYPE    Return in about 4 weeks (around 07/30/2024) for wart f/u.  LILLETTE Rollene Gobble, RN, am acting as scribe for RUFUS CHRISTELLA HOLY, MD .   Documentation: I have reviewed the above documentation for accuracy and completeness, and I agree with the above.  RUFUS CHRISTELLA HOLY, MD

## 2024-07-08 ENCOUNTER — Ambulatory Visit

## 2024-07-13 ENCOUNTER — Ambulatory Visit (INDEPENDENT_AMBULATORY_CARE_PROVIDER_SITE_OTHER): Admitting: *Deleted

## 2024-07-13 VITALS — Ht 62.0 in | Wt 168.0 lb

## 2024-07-13 DIAGNOSIS — Z0000 Encounter for general adult medical examination without abnormal findings: Secondary | ICD-10-CM | POA: Diagnosis not present

## 2024-07-13 NOTE — Patient Instructions (Signed)
 Lindsey Shepard , Thank you for taking time out of your busy schedule to complete your Annual Wellness Visit with me. I enjoyed our conversation and look forward to speaking with you again next year. I, as well as your care team,  appreciate your ongoing commitment to your health goals. Please review the following plan we discussed and let me know if I can assist you in the future. Your Game plan/ To Do List    Follow up Visits: Next Medicare AWV with our clinical staff: 07/16/25 11am, telephone.   Next Office Visit with your provider: 08/24/24 1pm, Dr Watt. You can get your flu vaccine this visit if you haven't received it yet.  Clinician Recommendations:  Aim for 30 minutes of exercise or brisk walking, 6-8 glasses of water, and 5 servings of fruits and vegetables each day.       This is a list of the screening recommended for you and due dates:  Health Maintenance  Topic Date Due   Zoster (Shingles) Vaccine (1 of 2) Never done   Medicare Annual Wellness Visit  10/10/2023   Flu Shot  06/05/2024   COVID-19 Vaccine (3 - 2025-26 season) 07/06/2024   DTaP/Tdap/Td vaccine (2 - Tdap) 05/02/2027   Pneumococcal Vaccine for age over 65  Completed   DEXA scan (bone density measurement)  Completed   Hepatitis C Screening  Completed   HPV Vaccine  Aged Out   Meningitis B Vaccine  Aged Out   Colon Cancer Screening  Discontinued    Advanced directives: (Copy Requested) Please bring a copy of your health care power of attorney and living will to the office to be added to your chart at your convenience. You can mail to [facility] [address] [place], [place] [address] or email to [email or web address] Advance Care Planning is important because it:  [x]  Makes sure you receive the medical care that is consistent with your values, goals, and preferences  [x]  It provides guidance to your family and loved ones and reduces their decisional burden about whether or not they are  making the right decisions based on your wishes.  Follow the link provided in your after visit summary or read over the paperwork we have mailed to you to help you started getting your Advance Directives in place. If you need assistance in completing these, please reach out to us  so that we can help you!  See attachments for Preventive Care and Fall Prevention Tips.

## 2024-07-13 NOTE — Progress Notes (Signed)
 Subjective:   Lindsey Shepard is a 78 y.o. who presents for a Medicare Wellness preventive visit.  As a reminder, Annual Wellness Visits don't include a physical exam, and some assessments may be limited, especially if this visit is performed virtually. We may recommend an in-person follow-up visit with your provider if needed.  Visit Complete: Virtual I connected with  Lindsey Shepard on 07/13/24 by a audio enabled telemedicine application and verified that I am speaking with the correct person using two identifiers.  Patient Location: Home  Provider Location: Office/Clinic  I discussed the limitations of evaluation and management by telemedicine. The patient expressed understanding and agreed to proceed.  Vital Signs: Because this visit was a virtual/telehealth visit, some criteria may be missing or patient reported. Any vitals not documented were not able to be obtained and vitals that have been documented are patient reported.  VideoDeclined- This patient declined Librarian, academic. Therefore the visit was completed with audio only.  Persons Participating in Visit: Patient.  AWV Questionnaire: Yes: Patient Medicare AWV questionnaire was completed by the patient on 07/06/24; I have confirmed that all information answered by patient is correct and no changes since this date.  Cardiac Risk Factors include: advanced age (>43men, >44 women);dyslipidemia;hypertension     Objective:    Today's Vitals   07/13/24 1105  Weight: 168 lb (76.2 kg)  Height: 5' 2 (1.575 m)   Body mass index is 30.73 kg/m.     07/13/2024   11:23 AM 12/25/2022    1:35 PM 07/10/2022    2:23 PM 06/19/2022    9:51 AM 06/12/2022    9:59 AM 09/25/2021   11:11 AM  Advanced Directives  Does Patient Have a Medical Advance Directive? Yes Yes Yes No No Yes  Type of Estate agent of [place];Living will Healthcare Power of [place];Living will Healthcare Power of  [place];Living will   Healthcare Power of [place];Living will  Does patient want to make changes to medical advance directive? No - Patient declined No - Patient declined      Copy of Healthcare Power of Attorney in Chart? Yes - validated most recent copy scanned in chart (See row information) No - copy requested  No - copy requested No - copy requested No - copy requested  Would patient like information on creating a medical advance directive?    No - Patient declined No - Patient declined     Current Medications (verified) Outpatient Encounter Medications as of 07/13/2024  Medication Sig   aspirin  EC 81 MG tablet Take 81 mg by mouth daily. Swallow whole.   benazepril -hydrochlorthiazide (LOTENSIN  HCT) 20-12.5 MG tablet Take 2 tablets by mouth daily.   EPINEPHrine  0.3 mg/0.3 mL IJ SOAJ injection Inject 0.3 mg into the muscle as needed for anaphylaxis.   fluorouracil  (EFUDEX ) 5 % cream Apply topically 2 (two) times daily.   gabapentin  (NEURONTIN ) 300 MG capsule Take 1 capsule (300 mg total) by mouth 2 (two) times daily.   Multiple Vitamin (MULTI-VITAMINS) TABS Take by mouth.   Omega-3 Fatty Acids (FISH OIL ) 1000 MG CAPS 1 TAB DAILY   OVER THE COUNTER MEDICATION OTC Calcium and Magnesium taking 2 tabs daily   [DISCONTINUED] methocarbamol (ROBAXIN) 500 MG tablet Take 500 mg by mouth every 6 (six) hours as needed.   [DISCONTINUED] predniSONE  (DELTASONE ) 10 MG tablet Take 10 mg by mouth daily with breakfast.   [DISCONTINUED] traMADol  (ULTRAM ) 50 MG tablet Take 1 tablet (50 mg total) by  mouth every 8 (eight) hours as needed.   No facility-administered encounter medications on file as of 07/13/2024.    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   Past Surgical History:  Procedure Laterality Date   ABDOMINAL HYSTERECTOMY  1997   per Dr Macario Sharps    BREAST BIOPSY     fracture left arm     with hardware   KNEE ARTHROSCOPY WITH MEDIAL MENISECTOMY Right 06/19/2022   Procedure: RIGHT KNEE ARTHROSCOPY WITH MEDIAL MENISECTOMY;  Surgeon: Genelle Standing, MD;  Location: [facility];  Service: Orthopedics;  Laterality: Right;   left arm surgery  2009   reflex sympathetic dystrophy- 9 surgeries   NEPHRECTOMY  1993   right   Family History  Problem Relation Age of Onset   Heart disease Mother    Diabetes Father    Pancreatic cancer Father    Heart disease Brother    Social History   Socioeconomic History   Marital status: Widowed    Spouse name: Not on file   Number of children: 1   Years of education: BS   Highest education level: Bachelor's degree (e.g., BA, AB, BS)  Occupational History   Occupation: Retired  Tobacco Use   Smoking status: Never   Smokeless tobacco: Never  Vaping Use   Vaping status: Never Used  Substance and Sexual Activity   Alcohol use: Yes    Alcohol/week: 4.0 standard drinks of alcohol    Types: 4 Glasses of wine per week    Comment: 1 glass/week   Drug use: No   Sexual activity: Not Currently  Other Topics Concern   Not on file  Social History Narrative   Right handed    Coffee every morning   Lives alone   Social Drivers of Health   Financial Resource Strain: Low Risk  (07/06/2024)   Overall Financial Resource Strain (CARDIA)    Difficulty of Paying Living Expenses: Not hard at all  Food Insecurity: No Food Insecurity (07/06/2024)   Hunger Vital Sign    Worried About Running Out of Food in the Last Year: Never true    Ran Out of Food in the Last Year: Never true  Transportation Needs: No Transportation  Needs (07/06/2024)   PRAPARE - Administrator, Civil Service (Medical): No    Lack of Transportation (Non-Medical): No  Physical Activity: Sufficiently Active (07/06/2024)   Exercise Vital Sign    Days of Exercise per Week: 6 days    Minutes of Exercise per Session: 60 min  Stress: No Stress Concern Present (07/06/2024)   [employer] of Occupational Health - Occupational Stress Questionnaire    Feeling of Stress: Only a little  Social Connections: Moderately Isolated (07/06/2024)   Social Connection and Isolation Panel    Frequency of Communication with Friends and Family: Three times a week    Frequency of Social Gatherings with Friends and Family: Once a week    Attends Religious Services: Never    Database administrator or Organizations: Yes    Attends Engineer, structural: More than 4 times  per year    Marital Status: Widowed    Tobacco Counseling Counseling given: Not Answered    Clinical Intake:  Pre-visit preparation completed: Yes        BMI - recorded: 30.73 Nutritional Status: BMI > 30  Obese Nutritional Risks: None Diabetes: No  Lab Results  Component Value Date   HGBA1C 6.2 05/08/2023   HGBA1C 6.0 03/12/2022   HGBA1C 6.1 06/07/2021     How often do you need to have someone help you when you read instructions, pamphlets, or other written materials from your doctor or pharmacy?: 1 - Never What is the last grade level you completed in school?: Bachelor's degree  Interpreter Needed?: No  Information entered by :: Lolita Libra, CMA (AAMA)   Activities of Daily Living     07/06/2024   11:00 AM  In your present state of health, do you have any difficulty performing the following activities:  Hearing? 0  Vision? 0  Difficulty concentrating or making decisions? 0  Walking or climbing stairs? 0  Dressing or bathing? 0  Doing errands, shopping? 0  Preparing Food and eating ? N  Using the Toilet? N  In the past six months, have  you accidently leaked urine? Y  Comment x 2 years. Has done some PT exercises that help but don't eliminate symptoms  Do you have problems with loss of bowel control? N  Managing your Medications? N  Managing your Finances? N  Housekeeping or managing your Housekeeping? N    Patient Care Team: Copland, Harlene BROCKS, MD as PCP - General (Family Medicine) Leila Bound, OD (Optometry)  I have updated your Care Teams any recent Medical Services you may have received from other providers in the past year.     Assessment:   This is a routine wellness examination for [place].  Hearing/Vision screen Hearing Screening - Comments:: Has some difficulty in crowds but hearing aids not needed at this time.  Vision Screening - Comments:: Up to date with [facility]. Has cataracts, postponing surgery until next year.   Goals Addressed   None    Depression Screen     07/13/2024   11:13 AM 10/09/2022   10:27 AM 09/25/2021   11:17 AM 09/15/2020   10:18 AM 11/21/2017    9:42 AM 04/30/2016    2:06 PM 11/08/2015   12:20 PM  PHQ 2/9 Scores  PHQ - 2 Score 0 0 1 0 0 0 0  PHQ- 9 Score 2          Fall Risk     07/06/2024   11:00 AM 10/09/2022   10:27 AM 09/25/2021   11:13 AM 09/15/2020   10:14 AM 11/21/2017    9:42 AM  Fall Risk   Falls in the past year? 0 0 0 0 No   Number falls in past yr: 0 0 0 0   Injury with Fall? 0 0 0 0   Risk for fall due to : No Fall Risks No Fall Risks     Follow up Education provided Falls evaluation completed  Falls prevention discussed        Data saved with a previous flowsheet row definition    MEDICARE RISK AT HOME:  Medicare Risk at Home Any stairs in or around the home?: (Patient-Rptd) No Home free of loose throw rugs in walkways, pet beds, electrical cords, etc?: (Patient-Rptd) Yes Adequate lighting in your home to reduce risk of falls?: (Patient-Rptd) Yes Life alert?: (Patient-Rptd) No Use of  a cane, walker or w/c?: (Patient-Rptd) No Grab bars in the  bathroom?: (Patient-Rptd) No Shower chair or bench in shower?: (Patient-Rptd) No Elevated toilet seat or a handicapped toilet?: (Patient-Rptd) No  TIMED UP AND GO:  Was the test performed?  No,auido  Cognitive Function: 6CIT completed        07/13/2024   11:24 AM  6CIT Screen  What Year? 0 points  What month? 0 points  What time? 0 points  Count back from 20 0 points  Months in reverse 0 points  Repeat phrase 0 points  Total Score 0 points    Immunizations Immunization History  Administered Date(s) Administered   INFLUENZA, HIGH DOSE SEASONAL PF 08/25/2018   Influenza Split 08/26/2017   Influenza, Mdck, Trivalent,PF 6+ MOS(egg free) 09/11/2023   Influenza,inj,Quad PF,6+ Mos 08/26/2017, 08/17/2019   Influenza-Unspecified 08/11/2015, 08/19/2020, 08/14/2021, 08/21/2022   Moderna Sars-Covid-2 Vaccination 01/06/2020, 02/04/2020   Pneumococcal Conjugate-13 03/14/2015   Pneumococcal Polysaccharide-23 04/30/2016, 07/31/2022   Td 05/01/2017    Screening Tests Health Maintenance  Topic Date Due   Medicare Annual Wellness (AWV)  10/10/2023   Influenza Vaccine  06/05/2024   COVID-19 Vaccine (3 - 2025-26 season) 07/06/2024   Zoster Vaccines- Shingrix (1 of 2) 07/13/2025 (Originally 08/19/1995)   DTaP/Tdap/Td (2 - Tdap) 05/02/2027   Pneumococcal Vaccine: 50+ Years  Completed   DEXA SCAN  Completed   Hepatitis C Screening  Completed   HPV VACCINES  Aged Out   Meningococcal B Vaccine  Aged Out   Colonoscopy  Discontinued    Health Maintenance Items Addressed: Declines shingles vaccine, will get flu vaccine in the fall.  Additional Screening:  Vision Screening: Recommended annual ophthalmology exams for early detection of glaucoma and other disorders of the eye. Is the patient up to date with their annual eye exam?  Yes  Who is the provider or what is the name of the office in which the patient attends annual eye exams? [facility]  Dental Screening: Recommended annual  dental exams for proper oral hygiene  Community Resource Referral / Chronic Care Management: CRR required this visit?  No   CCM required this visit?  No   Plan:    I have personally reviewed and noted the following in the patient's chart:   Medical and social history Use of alcohol, tobacco or illicit drugs  Current medications and supplements including opioid prescriptions. Patient is not currently taking opioid prescriptions. Functional ability and status Nutritional status Physical activity Advanced directives List of other physicians Hospitalizations, surgeries, and ER visits in previous 12 months Vitals Screenings to include cognitive, depression, and falls Referrals and appointments  In addition, I have reviewed and discussed with patient certain preventive protocols, quality metrics, and best practice recommendations. A written personalized care plan for preventive services as well as general preventive health recommendations were provided to patient.   Lolita Libra, CMA   07/13/2024   After Visit Summary: (MyChart) Due to this being a telephonic visit, the after visit summary with patients personalized plan was offered to patient via MyChart   Notes: Nothing significant to report at this time.

## 2024-07-15 ENCOUNTER — Ambulatory Visit (INDEPENDENT_AMBULATORY_CARE_PROVIDER_SITE_OTHER)

## 2024-07-15 DIAGNOSIS — T63441D Toxic effect of venom of bees, accidental (unintentional), subsequent encounter: Secondary | ICD-10-CM | POA: Diagnosis not present

## 2024-07-22 ENCOUNTER — Ambulatory Visit (INDEPENDENT_AMBULATORY_CARE_PROVIDER_SITE_OTHER)

## 2024-07-22 DIAGNOSIS — T63441D Toxic effect of venom of bees, accidental (unintentional), subsequent encounter: Secondary | ICD-10-CM | POA: Diagnosis not present

## 2024-07-29 ENCOUNTER — Ambulatory Visit (INDEPENDENT_AMBULATORY_CARE_PROVIDER_SITE_OTHER)

## 2024-07-29 DIAGNOSIS — T63441D Toxic effect of venom of bees, accidental (unintentional), subsequent encounter: Secondary | ICD-10-CM

## 2024-08-03 ENCOUNTER — Ambulatory Visit: Admitting: Dermatology

## 2024-08-03 ENCOUNTER — Encounter: Payer: Self-pay | Admitting: Dermatology

## 2024-08-03 VITALS — BP 138/70 | HR 62

## 2024-08-03 DIAGNOSIS — Z7189 Other specified counseling: Secondary | ICD-10-CM | POA: Diagnosis not present

## 2024-08-03 DIAGNOSIS — B079 Viral wart, unspecified: Secondary | ICD-10-CM | POA: Diagnosis not present

## 2024-08-03 NOTE — Patient Instructions (Signed)
 For areas treated with Liquid Nitrogen:  Keep clean with soap and water.  Apply Vaseline or Aquaphor twice daily.  Important Information  Due to recent changes in healthcare laws, you may see results of your pathology and/or laboratory studies on MyChart before the doctors have had a chance to review them. We understand that in some cases there may be results that are confusing or concerning to you. Please understand that not all results are received at the same time and often the doctors may need to interpret multiple results in order to provide you with the best plan of care or course of treatment. Therefore, we ask that you please give [place] 2 business days to thoroughly review all your results before contacting the office for clarification. Should we see a critical lab result, you will be contacted sooner.   If You Need Anything After Your Visit  If you have any questions or concerns for your doctor, please call our main line at [phone] If no one answers, please leave a voicemail as directed and we will return your call as soon as possible. Messages left after 4 pm will be answered the following business day.   You may also send [place] a message via MyChart. We typically respond to MyChart messages within 1-2 business days.  For prescription refills, please ask your pharmacy to contact our office. Our fax number is [phone].  If you have an urgent issue when the clinic is closed that cannot wait until the next business day, you can page your doctor at the number below.    Please note that while we do our best to be available for urgent issues outside of office hours, we are not available 24/7.   If you have an urgent issue and are unable to reach [place], you may choose to seek medical care at your doctor's office, retail clinic, urgent care center, or emergency room.  If you have a medical emergency, please immediately call 911 or go to the emergency department. In the event of inclement  weather, please call our main line at [phone] for an update on the status of any delays or closures.  Dermatology Medication Tips: Please keep the boxes that topical medications come in in order to help keep track of the instructions about where and how to use these. Pharmacies typically print the medication instructions only on the boxes and not directly on the medication tubes.   If your medication is too expensive, please contact our office at [phone] or send [place] a message through MyChart.   We are unable to tell what your co-pay for medications will be in advance as this is different depending on your insurance coverage. However, we may be able to find a substitute medication at lower cost or fill out paperwork to get insurance to cover a needed medication.   If a prior authorization is required to get your medication covered by your insurance company, please allow [place] 1-2 business days to complete this process.  Drug prices often vary depending on where the prescription is filled and some pharmacies may offer cheaper prices.  The website www.goodrx.com contains coupons for medications through different pharmacies. The prices here do not account for what the cost may be with help from insurance (it may be cheaper with your insurance), but the website can give you the price if you did not use any insurance.  - You can print the associated coupon and take it with your prescription to the pharmacy.  -  You may also stop by our office during regular business hours and pick up a GoodRx coupon card.  - If you need your prescription sent electronically to a different pharmacy, notify our office through [facility] or by phone at [phone]    Skin Education :   I counseled the patient regarding the following: Sun screen (SPF 30 or greater) should be applied during peak UV exposure (between 10am and 2pm) and reapplied after exercise or swimming.  The ABCDEs of melanoma were  reviewed with the patient, and the importance of monthly self-examination of moles was emphasized. Should any moles change in shape or color, or itch, bleed or burn, pt will contact our office for evaluation sooner then their interval appointment.  Plan: Sunscreen Recommendations I recommended a broad spectrum sunscreen with a SPF of 30 or higher. I explained that SPF 30 sunscreens block approximately 97 percent of the sun's harmful rays. Sunscreens should be applied at least 15 minutes prior to expected sun exposure and then every 2 hours after that as long as sun exposure continues. If swimming or exercising sunscreen should be reapplied every 45 minutes to an hour after getting wet or sweating. One ounce, or the equivalent of a shot glass full of sunscreen, is adequate to protect the skin not covered by a bathing suit. I also recommended a lip balm with a sunscreen as well. Sun protective clothing can be used in lieu of sunscreen but must be worn the entire time you are exposed to the sun's rays.

## 2024-08-03 NOTE — Progress Notes (Unsigned)
   Follow-Up Visit   Subjective  Lindsey Shepard is a 78 y.o. female who presents for the following: follow up for treatment of a wart. She states that she was recently stung by a yellow jacket and the warts reacted to what seemed to be an immune response to the sting. She has also been using topical 5FU o   The following portions of the chart were reviewed this encounter and updated as appropriate: medications, allergies, medical history  Review of Systems:  No other skin or systemic complaints except as noted in HPI or Assessment and Plan.  Objective  Well appearing patient in no apparent distress; mood and affect are within normal limits.   A focused examination was performed of the following areas: Bilateral hands  Relevant exam findings are noted in the Assessment and Plan.    Assessment & Plan   WART Exam: verrucous papule(s)  Counseling Discussed viral / HPV (Human Papilloma Virus) etiology and risk of spread /infectivity to other areas of body as well as to other people.  Multiple treatments and methods may be required to clear warts and it is possible treatment may not be successful.  Treatment risks include discoloration; scarring and there is still potential for wart recurrence.  Treatment Plan: Cryotherapy to the left 2nd finger and the right 4th finger. Continue Rx 5FU topical, to be used nightly under occlusion   VIRAL WARTS, UNSPECIFIED TYPE (2) Left Dorsal Mid 2nd Finger, Right 4th Finger Proximal Interphalangeal Joint Destruction of lesion - Left Dorsal Mid 2nd Finger, Right 4th Finger Proximal Interphalangeal Joint Complexity: extensive   Destruction method: cryotherapy   Informed consent: discussed and consent obtained   Timeout:  patient name, date of birth, surgical site, and procedure verified Lesion destroyed using liquid nitrogen: Yes   Region frozen until ice ball extended beyond lesion: Yes   Cryotherapy cycles:  2 Outcome: patient tolerated  procedure well with no complications   Post-procedure details: wound care instructions given     Return in about 4 weeks (around 08/31/2024) for Wart f/u .  I, Berwyn Lesches, Surg Tech III, am acting as scribe for RUFUS CHRISTELLA HOLY, MD.   Documentation: I have reviewed the above documentation for accuracy and completeness, and I agree with the above.  RUFUS CHRISTELLA HOLY, MD

## 2024-08-05 ENCOUNTER — Ambulatory Visit

## 2024-08-05 DIAGNOSIS — T63441D Toxic effect of venom of bees, accidental (unintentional), subsequent encounter: Secondary | ICD-10-CM

## 2024-08-12 ENCOUNTER — Ambulatory Visit

## 2024-08-12 DIAGNOSIS — T63441D Toxic effect of venom of bees, accidental (unintentional), subsequent encounter: Secondary | ICD-10-CM | POA: Diagnosis not present

## 2024-08-19 ENCOUNTER — Ambulatory Visit

## 2024-08-19 DIAGNOSIS — T63441D Toxic effect of venom of bees, accidental (unintentional), subsequent encounter: Secondary | ICD-10-CM
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
## 2024-08-24 ENCOUNTER — Encounter: Payer: Self-pay | Admitting: Family Medicine

## 2024-08-24 ENCOUNTER — Ambulatory Visit: Admitting: Family Medicine

## 2024-08-24 VITALS — BP 122/68 | HR 67 | Ht 62.0 in | Wt 170.0 lb

## 2024-08-24 DIAGNOSIS — N289 Disorder of kidney and ureter, unspecified: Secondary | ICD-10-CM | POA: Diagnosis not present

## 2024-08-24 DIAGNOSIS — Z23 Encounter for immunization: Secondary | ICD-10-CM | POA: Diagnosis not present

## 2024-08-24 DIAGNOSIS — Z1231 Encounter for screening mammogram for malignant neoplasm of breast: Secondary | ICD-10-CM

## 2024-08-24 DIAGNOSIS — Z130 Encounter for screening for diseases of the blood and blood-forming organs and certain disorders involving the immune mechanism: Secondary | ICD-10-CM

## 2024-08-24 DIAGNOSIS — Z5181 Encounter for therapeutic drug level monitoring: Secondary | ICD-10-CM | POA: Diagnosis not present

## 2024-08-24 DIAGNOSIS — Z131 Encounter for screening for diabetes mellitus: Secondary | ICD-10-CM

## 2024-08-24 DIAGNOSIS — Z13220 Encounter for screening for lipoid disorders: Secondary | ICD-10-CM | POA: Diagnosis not present

## 2024-08-24 DIAGNOSIS — E2839 Other primary ovarian failure: Secondary | ICD-10-CM | POA: Diagnosis not present

## 2024-08-24 DIAGNOSIS — Z1329 Encounter for screening for other suspected endocrine disorder: Secondary | ICD-10-CM | POA: Diagnosis not present

## 2024-08-24 LAB — COMPREHENSIVE METABOLIC PANEL WITH GFR
ALT: 20 U/L (ref 0–35)
AST: 20 U/L (ref 0–37)
Albumin: 4.6 g/dL (ref 3.5–5.2)
Alkaline Phosphatase: 68 U/L (ref 39–117)
BUN: 19 mg/dL (ref 6–23)
CO2: 31 meq/L (ref 19–32)
Calcium: 9.9 mg/dL (ref 8.4–10.5)
Chloride: 99 meq/L (ref 96–112)
Creatinine, Ser: 0.73 mg/dL (ref 0.40–1.20)
GFR: 78.44 mL/min (ref >60.00)
Glucose, Bld: 78 mg/dL (ref 70–99)
Potassium: 5.3 meq/L — ABNORMAL HIGH (ref 3.5–5.1)
Sodium: 139 meq/L (ref 135–145)
Total Bilirubin: 0.4 mg/dL (ref 0.2–1.2)
Total Protein: 6.5 g/dL (ref 6.0–8.3)

## 2024-08-24 LAB — CBC
HCT: 40.1 % (ref 36.0–46.0)
Hemoglobin: 13.4 g/dL (ref 12.0–15.0)
MCHC: 33.5 g/dL (ref 30.0–36.0)
MCV: 90.6 fl (ref 78.0–100.0)
Platelets: 591 K/uL — ABNORMAL HIGH (ref 150.0–400.0)
RBC: 4.43 Mil/uL (ref 3.87–5.11)
RDW: 14.9 % (ref 11.5–15.5)
WBC: 8.3 K/uL (ref 4.0–10.5)

## 2024-08-24 LAB — LIPID PANEL
Cholesterol: 258 mg/dL — ABNORMAL HIGH (ref 0–200)
HDL: 49.6 mg/dL (ref >39.00)
LDL Cholesterol: 172 mg/dL — ABNORMAL HIGH (ref 0–99)
NonHDL: 207.99
Total CHOL/HDL Ratio: 5
Triglycerides: 181 mg/dL — ABNORMAL HIGH (ref 0.0–149.0)
VLDL: 36.2 mg/dL (ref 0.0–40.0)

## 2024-08-24 LAB — TSH: TSH: 2.08 u[IU]/mL (ref 0.35–5.50)

## 2024-08-24 LAB — HEMOGLOBIN A1C: Hgb A1c MFr Bld: 6.2 % (ref 4.6–6.5)

## 2024-08-24 NOTE — Patient Instructions (Addendum)
 It was good to see you today- I will be in touch with your labs asap  Please schedule your mammo and dexa scan with our imaging dept at your convenience Flu shot today Recommend covid booster and shingles vaccine series at your pharmacy at your convenience Take care!

## 2024-08-26 ENCOUNTER — Ambulatory Visit

## 2024-08-27 ENCOUNTER — Encounter: Payer: Self-pay | Admitting: Family Medicine

## 2024-08-28 ENCOUNTER — Ambulatory Visit

## 2024-08-28 DIAGNOSIS — T63441D Toxic effect of venom of bees, accidental (unintentional), subsequent encounter: Secondary | ICD-10-CM

## 2024-09-01 ENCOUNTER — Ambulatory Visit: Admitting: Dermatology

## 2024-09-01 ENCOUNTER — Encounter: Payer: Self-pay | Admitting: Dermatology

## 2024-09-01 VITALS — BP 156/78

## 2024-09-01 DIAGNOSIS — B079 Viral wart, unspecified: Secondary | ICD-10-CM

## 2024-09-01 DIAGNOSIS — Z8619 Personal history of other infectious and parasitic diseases: Secondary | ICD-10-CM | POA: Diagnosis not present

## 2024-09-01 DIAGNOSIS — Z09 Encounter for follow-up examination after completed treatment for conditions other than malignant neoplasm: Secondary | ICD-10-CM | POA: Diagnosis not present

## 2024-09-01 NOTE — Patient Instructions (Signed)

## 2024-09-01 NOTE — Progress Notes (Signed)
   Follow-Up Visit   Subjective  Lindsey Shepard is a 78 y.o. female who presents for the following: follow up treatment for wart. Patient was last seen on 08/03/2024 at which time the 3rd treatment of cryotherapy was performed to the left dorsal mid 2nd finger and the right 4th proximal interphalangeal joint. Patient has been using efudex  nightly under occulusion to the areas.   The following portions of the chart were reviewed this encounter and updated as appropriate: medications, allergies, medical history  Review of Systems:  No other skin or systemic complaints except as noted in HPI or Assessment and Plan.  Objective  Well appearing patient in no apparent distress; mood and affect are within normal limits.   A focused examination was performed of the following areas: Bilateral hands  Relevant exam findings are noted in the Assessment and Plan.       Assessment & Plan   History of Warts- resolved Exam: Left Dorsal Mid 2nd Finger, Right 4th Finger Proximal Interphalangeal Joint   Treatment Plan: Patient advised to discontinue Efudex . Advised that she may restart the efudex  if there is a reappearance.   Return if symptoms worsen or fail to improve.  LILLETTE Rollene Gobble, RN, am acting as scribe for RUFUS CHRISTELLA HOLY, MD .   Documentation: I have reviewed the above documentation for accuracy and completeness, and I agree with the above.  RUFUS CHRISTELLA HOLY, MD

## 2024-09-02 ENCOUNTER — Ambulatory Visit

## 2024-09-02 DIAGNOSIS — T63441D Toxic effect of venom of bees, accidental (unintentional), subsequent encounter: Secondary | ICD-10-CM

## 2024-09-16 ENCOUNTER — Ambulatory Visit (INDEPENDENT_AMBULATORY_CARE_PROVIDER_SITE_OTHER)

## 2024-09-16 ENCOUNTER — Ambulatory Visit: Payer: Self-pay

## 2024-09-16 DIAGNOSIS — T63441D Toxic effect of venom of bees, accidental (unintentional), subsequent encounter: Secondary | ICD-10-CM

## 2024-10-07 ENCOUNTER — Ambulatory Visit

## 2024-10-07 DIAGNOSIS — T63481D Toxic effect of venom of other arthropod, accidental (unintentional), subsequent encounter: Secondary | ICD-10-CM | POA: Diagnosis not present

## 2024-10-07 DIAGNOSIS — T782XXD Anaphylactic shock, unspecified, subsequent encounter: Secondary | ICD-10-CM | POA: Diagnosis not present

## 2024-10-14 ENCOUNTER — Other Ambulatory Visit (HOSPITAL_BASED_OUTPATIENT_CLINIC_OR_DEPARTMENT_OTHER): Payer: Self-pay

## 2024-11-04 ENCOUNTER — Ambulatory Visit (INDEPENDENT_AMBULATORY_CARE_PROVIDER_SITE_OTHER)

## 2024-11-04 ENCOUNTER — Ambulatory Visit (HOSPITAL_BASED_OUTPATIENT_CLINIC_OR_DEPARTMENT_OTHER)
Admission: RE | Admit: 2024-11-04 | Discharge: 2024-11-04 | Disposition: A | Discharge status: Home / Self Care | Source: Ambulatory Visit | Attending: Family Medicine | Admitting: Family Medicine

## 2024-11-04 ENCOUNTER — Encounter (HOSPITAL_BASED_OUTPATIENT_CLINIC_OR_DEPARTMENT_OTHER): Payer: Self-pay

## 2024-11-04 DIAGNOSIS — Z1231 Encounter for screening mammogram for malignant neoplasm of breast: Secondary | ICD-10-CM

## 2024-11-04 DIAGNOSIS — T63441D Toxic effect of venom of bees, accidental (unintentional), subsequent encounter: Secondary | ICD-10-CM | POA: Diagnosis not present

## 2024-11-04 DIAGNOSIS — E2839 Other primary ovarian failure: Secondary | ICD-10-CM | POA: Insufficient documentation

## 2024-11-09 ENCOUNTER — Other Ambulatory Visit: Payer: Self-pay | Admitting: Family Medicine

## 2024-11-09 DIAGNOSIS — R928 Other abnormal and inconclusive findings on diagnostic imaging of breast: Secondary | ICD-10-CM

## 2024-11-10 ENCOUNTER — Encounter: Payer: Self-pay | Admitting: Family Medicine

## 2024-11-25 ENCOUNTER — Ambulatory Visit
Admission: RE | Admit: 2024-11-25 | Discharge: 2024-11-25 | Disposition: A | Source: Ambulatory Visit | Attending: Family Medicine | Admitting: Family Medicine

## 2024-11-25 ENCOUNTER — Other Ambulatory Visit: Payer: Self-pay | Admitting: Family Medicine

## 2024-11-25 DIAGNOSIS — R928 Other abnormal and inconclusive findings on diagnostic imaging of breast: Secondary | ICD-10-CM

## 2024-11-25 DIAGNOSIS — N6321 Unspecified lump in the left breast, upper outer quadrant: Secondary | ICD-10-CM

## 2024-12-02 ENCOUNTER — Ambulatory Visit

## 2024-12-09 ENCOUNTER — Ambulatory Visit

## 2024-12-16 ENCOUNTER — Ambulatory Visit

## 2025-05-28 ENCOUNTER — Other Ambulatory Visit

## 2025-07-16 ENCOUNTER — Ambulatory Visit
